# Patient Record
Sex: Female | Born: 1975 | Race: White | Hispanic: No | Marital: Married | Smoking: Never smoker
Health system: Southern US, Community
[De-identification: ages and names within clinical notes are randomized; demographics above are authoritative.]

## PROBLEM LIST (undated history)

## (undated) DIAGNOSIS — A630 Anogenital (venereal) warts: Secondary | ICD-10-CM

## (undated) HISTORY — PX: NO PAST SURGERIES: SHX2092

## (undated) HISTORY — DX: Anogenital (venereal) warts: A63.0

---

## 2013-04-21 DIAGNOSIS — A630 Anogenital (venereal) warts: Secondary | ICD-10-CM

## 2013-04-21 HISTORY — DX: Anogenital (venereal) warts: A63.0

## 2015-03-05 ENCOUNTER — Encounter: Payer: Self-pay | Admitting: Family Medicine

## 2015-03-05 ENCOUNTER — Ambulatory Visit (INDEPENDENT_AMBULATORY_CARE_PROVIDER_SITE_OTHER): Payer: BLUE CROSS/BLUE SHIELD | Admitting: Family Medicine

## 2015-03-05 VITALS — BP 107/56 | HR 91 | Temp 98.2°F | Resp 16 | Ht 68.0 in | Wt 147.5 lb

## 2015-03-05 DIAGNOSIS — R6889 Other general symptoms and signs: Secondary | ICD-10-CM | POA: Diagnosis not present

## 2015-03-05 DIAGNOSIS — R319 Hematuria, unspecified: Secondary | ICD-10-CM

## 2015-03-05 DIAGNOSIS — N390 Urinary tract infection, site not specified: Secondary | ICD-10-CM | POA: Diagnosis not present

## 2015-03-05 LAB — POCT INFLUENZA A/B
INFLUENZA B, POC: NEGATIVE
Influenza A, POC: NEGATIVE

## 2015-03-05 LAB — POCT URINALYSIS DIPSTICK
Glucose, UA: NEGATIVE
KETONES UA: NEGATIVE
PH UA: 5
Protein, UA: NEGATIVE
SPEC GRAV UA: 1.01
Urobilinogen, UA: NEGATIVE

## 2015-03-05 MED ORDER — NITROFURANTOIN MONOHYD MACRO 100 MG PO CAPS: 100.0000 mg | ORAL_CAPSULE | Freq: Two times a day (BID) | ORAL | Status: DC

## 2015-03-05 MED ORDER — CIPROFLOXACIN HCL 500 MG PO TABS: 500.0000 mg | ORAL_TABLET | Freq: Two times a day (BID) | ORAL | Status: AC

## 2015-03-05 NOTE — Progress Notes (Signed)
Subjective:    Patient ID: Lori Moran, female    DOB: 1975-07-26, 39 y.o.   MRN: [id number]  HPI: Lori Moran is a 39 y.o. female presenting on 03/05/2015 for Urinary Tract Infection   HPI  Pt presents as a new patient today with a possible UTI. Symptoms began on Thursday evening with urinary frequency. Low abdominal pain on Sunday. Pt also feeling malaise with no nausea. Pt reporting low back pain. Low grade fever at home. 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 She is also reporting chills and malaise.   Health maintenance:  Last pap- 1 year ago.  Exercises regularly. Tries to eat a healthy diet.   History reviewed. No pertinent past medical history. Social History   Social History  . Marital Status: Single    Spouse Name: N/A  . Number of Children: N/A  . Years of Education: N/A   Occupational History  . Not on file.   Social History Main Topics  . Smoking status: Never Smoker   . Smokeless tobacco: Not on file  . Alcohol Use: No  . Drug Use: No  . Sexual Activity: Yes     Comment: partner had vasectomy   Other Topics Concern  . Not on file   Social History Narrative  . No narrative on file   Family History  Problem Relation Age of Onset  . Healthy Mother   . Heart disease Maternal Grandfather    No current outpatient prescriptions on file prior to visit.   No current facility-administered medications on file prior to visit.    Review of Systems  Constitutional: Positive for fever (low grade at home- 99) and chills.  HENT: Negative for congestion, rhinorrhea and sinus pressure.   Respiratory: Negative for shortness of breath and wheezing.   Gastrointestinal: Positive for abdominal pain.  Genitourinary: Positive for dysuria, urgency and frequency. Negative for vaginal bleeding and vaginal discharge.  Musculoskeletal: Positive for myalgias.  Neurological: Negative for syncope and light-headedness.    Psychiatric/Behavioral: Negative.    Per HPI unless specifically indicated above     Objective:    BP 107/56 mmHg  Pulse 91  Temp(Src) 98.2 F (36.8 C) (Oral)  Resp 16  Ht 5\' 8"  (1.727 m)  Wt 147 lb 8 oz (66.906 kg)  BMI 22.43 kg/m2  LMP  (LMP Unknown)  Wt Readings from Last 3 Encounters:  03/05/15 147 lb 8 oz (66.906 kg)    Physical Exam  Constitutional: She is oriented to person, place, and time. She appears well-developed and well-nourished. No distress.  HENT:  Head: Normocephalic and atraumatic.  Right Ear: Hearing and tympanic membrane normal.  Left Ear: Hearing and tympanic membrane normal.  Nose: No mucosal edema or rhinorrhea. Right sinus exhibits no maxillary sinus tenderness and no frontal sinus tenderness. Left sinus exhibits no maxillary sinus tenderness and no frontal sinus tenderness.  Mouth/Throat: Uvula is midline and mucous membranes are normal. No oropharyngeal exudate or posterior oropharyngeal erythema.  Cardiovascular: Normal rate and regular rhythm.  Exam reveals no gallop and no friction rub.   No murmur heard. Pulmonary/Chest: Effort normal and breath sounds normal. No respiratory distress.  Abdominal: Soft. Normal appearance. There is CVA tenderness.  Neurological: She is alert and oriented to person, place, and time. No cranial nerve deficit. Coordination normal.  Skin: She is not diaphoretic.  Psychiatric: Her behavior is normal.   Results for orders placed or performed in visit on 03/05/15  POCT urinalysis dipstick  Result Value Ref Range   Color, UA yellow    Clarity, UA cloudy    Glucose, UA negative    Bilirubin, UA neative    Ketones, UA negaitve    Spec Grav, UA 1.010    Blood, UA trace    pH, UA 5.0    Protein, UA negative    Urobilinogen, UA negative    Nitrite, UA trace    Leukocytes, UA Trace (A) Negative  POCT Influenza A/B  Result Value Ref Range   Influenza A, POC Negative Negative   Influenza B, POC Negative Negative       Assessment & Plan:   Problem List Items Addressed This Visit    None    Visit Diagnoses    Urinary tract infection with hematuria, site unspecified    -  Primary    Treat with Cipro BID x 5 days. Alarm symptoms reviewed. Return precautions reviewed. Urine culture sent.     Relevant Medications    ciprofloxacin (CIPRO) 500 MG tablet    Other Relevant Orders    POCT urinalysis dipstick (Completed)    CULTURE, URINE COMPREHENSIVE    Flu-like symptoms        Flu swab negative. Likely from UTI. However given pt profession might be virus. Alarm symptoms reviewed.     Relevant Orders    POCT Influenza A/B (Completed)       Meds ordered this encounter  Medications  . DISCONTD: nitrofurantoin, macrocrystal-monohydrate, (MACROBID) 100 MG capsule    Sig: Take 1 capsule (100 mg total) by mouth 2 (two) times daily.    Dispense:  14 capsule    Refill:  0    Order Specific Question:  Supervising Provider    Answer:  Arlis Porta [id number]  . ciprofloxacin (CIPRO) 500 MG tablet    Sig: Take 1 tablet (500 mg total) by mouth 2 (two) times daily.    Dispense:  10 tablet    Refill:  0    Order Specific Question:  Supervising Provider    Answer:  Arlis Porta [id number]      Follow up plan: Return if symptoms worsen or fail to improve.

## 2015-03-05 NOTE — Patient Instructions (Signed)

## 2015-03-07 ENCOUNTER — Telehealth: Payer: Self-pay | Admitting: *Deleted

## 2015-03-07 LAB — CULTURE, URINE COMPREHENSIVE

## 2015-03-07 NOTE — Telephone Encounter (Signed)
Called and spoke with patient. She was advised to go to the ER last night but did not go. She is feeling much better this morning after taking ibuprofen. Reinforced symptoms that need to be address in ER or Urgent care. Her urine culture is not back yet.  Pt instructed to complete antibiotic for UTI.

## 2015-03-07 NOTE — Addendum Note (Signed)
Addended by: Frederich Cha D on: 03/07/2015 11:34 AM   Modules accepted: Miquel Dunn

## 2015-03-07 NOTE — Addendum Note (Signed)
Addended by: Frederich Cha D on: 03/07/2015 09:38 AM   Modules accepted: Miquel Dunn

## 2015-03-07 NOTE — Telephone Encounter (Signed)
Patient was seen on yesterday. She called after hours c/o abd pain with mild back pain. Pain 2/10. She was dx with UTI and pr Cipro 500 bid given.

## 2016-05-29 ENCOUNTER — Encounter: Payer: Self-pay | Admitting: Family Medicine

## 2016-05-30 ENCOUNTER — Ambulatory Visit (INDEPENDENT_AMBULATORY_CARE_PROVIDER_SITE_OTHER): Payer: BLUE CROSS/BLUE SHIELD | Admitting: Physician Assistant

## 2016-05-30 ENCOUNTER — Encounter: Payer: Self-pay | Admitting: Physician Assistant

## 2016-05-30 VITALS — BP 113/66 | HR 61 | Resp 16 | Ht 68.0 in | Wt 143.0 lb

## 2016-05-30 DIAGNOSIS — R350 Frequency of micturition: Secondary | ICD-10-CM

## 2016-05-30 DIAGNOSIS — M545 Low back pain, unspecified: Secondary | ICD-10-CM

## 2016-05-30 LAB — POCT URINALYSIS DIPSTICK
Bilirubin, UA: NEGATIVE
Blood, UA: NEGATIVE
Glucose, UA: NEGATIVE
Ketone: NEGATIVE
Leukocytes, UA: NEGATIVE
Nitrite, UA: NEGATIVE
Protein, UA: NEGATIVE
Spec Grav, UA: <=1.005
Urobilinogen, UA: 0.2
pH, UA: 5

## 2016-05-30 NOTE — Progress Notes (Signed)
   Subjective:    Patient ID: Lori Moran, female    DOB: 1975-05-07, 41 y.o.   MRN: [id number]  Lori Moran is a 41 y.o. female presenting on 05/30/2016 for Urinary Tract Infection   HPI   Patient is a 41 y/o female with no significant PMH presenting today for evaluation of low back pain and urinary frequency. She ran for thirty minutes a couple days ago and experienced some bilateral low back pain for a couple hours. She says the pain was severe. Did not radiate. She also had some lower abdominal pain. No nausea or vomiting. No vaginal discharge of bleeding. LMP was one week ago. No injuries that she knows of. She also thought she might be having some urinary frequency, but denies dysuria. No leg numbness, tingling, weakness.  Social History  Substance Use Topics  . Smoking status: Never Smoker  . Smokeless tobacco: Never Used  . Alcohol use No    Review of Systems Per HPI unless specifically indicated above     Objective:    BP 113/66 (BP Location: Right Arm, Patient Position: Sitting, Cuff Size: Normal)   Pulse 61   Resp 16   Ht 5\' 8"  (1.727 m)   Wt 143 lb (64.9 kg)   BMI 21.74 kg/m   Wt Readings from Last 3 Encounters:  05/30/16 143 lb (64.9 kg)  03/05/15 147 lb 8 oz (66.9 kg)    Physical Exam  Constitutional: She appears well-developed and well-nourished.  Cardiovascular: Normal rate.   Pulmonary/Chest: Effort normal.  Abdominal: Soft. Normal appearance and bowel sounds are normal. She exhibits no distension and no mass. There is no tenderness. There is no rebound, no guarding and no CVA tenderness.  Neurological: She is alert.  Skin: Skin is warm and dry.  Psychiatric: She has a normal mood and affect. Her behavior is normal.   Results for orders placed or performed in visit on 05/30/16  POCT urinalysis dipstick  Result Value Ref Range   Color, UA yellow    Clarity, UA clear    Glucose, UA neg    Bilirubin, UA neg    Ketone neg    Spec Grav, UA <=1.005    Blood,  UA neg    pH, UA 5.0    Protein, UA neg    Urobilinogen, UA 0.2    Nitrite, UA neg    Leukocytes, UA Negative Negative      Assessment & Plan:   1. Urinary frequency  Urinalysis negative in office, patient not symptomatic, will not send for culture.  - POCT urinalysis dipstick  2. Acute bilateral low back pain without sciatica  Seems transient in the context of exercise. Can use pain relief as PRN, heat, ice. Activity as tolerated.  Follow up plan: Return if symptoms worsen or fail to improve.  A total of 15 minutes was spent face-to-face with this patient. Greater than 50% of this time was spent in counseling and coordination of care with the patient.   Carles Collet, PA-C [facility] 05/30/2016, 8:51 AM

## 2016-05-30 NOTE — Patient Instructions (Signed)
Back Pain, Adult Introduction Back pain is very common. The pain often gets better over time. The cause of back pain is usually not dangerous. Most people can learn to manage their back pain on their own. Follow these instructions at home: Watch your back pain for any changes. The following actions may help to lessen any pain you are feeling:  Stay active. Start with short walks on flat ground if you can. Try to walk farther each day.  Exercise regularly as told by your doctor. Exercise helps your back heal faster. It also helps avoid future injury by keeping your muscles strong and flexible.  Do not sit, drive, or stand in one place for more than 30 minutes.  Do not stay in bed. Resting more than 1-2 days can slow down your recovery.  Be careful when you bend or lift an object. Use good form when lifting:  Bend at your knees.  Keep the object close to your body.  Do not twist.  Sleep on a firm mattress. Lie on your side, and bend your knees. If you lie on your back, put a pillow under your knees.  Take medicines only as told by your doctor.  Put ice on the injured area.  Put ice in a plastic bag.  Place a towel between your skin and the bag.  Leave the ice on for 20 minutes, 2-3 times a day for the first 2-3 days. After that, you can switch between ice and heat packs.  Avoid feeling anxious or stressed. Find good ways to deal with stress, such as exercise.  Maintain a healthy weight. Extra weight puts stress on your back. Contact a doctor if:  You have pain that does not go away with rest or medicine.  You have worsening pain that goes down into your legs or buttocks.  You have pain that does not get better in one week.  You have pain at night.  You lose weight.  You have a fever or chills. Get help right away if:  You cannot control when you poop (bowel movement) or pee (urinate).  Your arms or legs feel weak.  Your arms or legs lose feeling  (numbness).  You feel sick to your stomach (nauseous) or throw up (vomit).  You have belly (abdominal) pain.  You feel like you may pass out (faint). This information is not intended to replace advice given to you by your health care provider. Make sure you discuss any questions you have with your health care provider. Document Released: 09/24/2007 Document Revised: 09/13/2015 Document Reviewed: 08/09/2013  2017 Elsevier  

## 2016-06-25 ENCOUNTER — Ambulatory Visit (INDEPENDENT_AMBULATORY_CARE_PROVIDER_SITE_OTHER): Payer: BLUE CROSS/BLUE SHIELD | Admitting: Obstetrics and Gynecology

## 2016-06-25 ENCOUNTER — Encounter: Payer: Self-pay | Admitting: Obstetrics and Gynecology

## 2016-06-25 VITALS — BP 106/65 | HR 60 | Ht 69.0 in | Wt 142.2 lb

## 2016-06-25 DIAGNOSIS — Z124 Encounter for screening for malignant neoplasm of cervix: Secondary | ICD-10-CM

## 2016-06-25 DIAGNOSIS — Z01419 Encounter for gynecological examination (general) (routine) without abnormal findings: Secondary | ICD-10-CM

## 2016-06-25 DIAGNOSIS — Z131 Encounter for screening for diabetes mellitus: Secondary | ICD-10-CM

## 2016-06-25 NOTE — Patient Instructions (Addendum)
Health Maintenance, Female Adopting a healthy lifestyle and getting preventive care can go a long way to promote health and wellness. Talk with your health care provider about what schedule of regular examinations is right for you. This is a good chance for you to check in with your provider about disease prevention and staying healthy. In between checkups, there are plenty of things you can do on your own. Experts have done a lot of research about which lifestyle changes and preventive measures are most likely to keep you healthy. Ask your health care provider for more information. Weight and diet Eat a healthy diet  Be sure to include plenty of vegetables, fruits, low-fat dairy products, and lean protein.  Do not eat a lot of foods high in solid fats, added sugars, or salt.  Get regular exercise. This is one of the most important things you can do for your health.  Most adults should exercise for at least 150 minutes each week. The exercise should increase your heart rate and make you sweat (moderate-intensity exercise).  Most adults should also do strengthening exercises at least twice a week. This is in addition to the moderate-intensity exercise. Maintain a healthy weight  Body mass index (BMI) is a measurement that can be used to identify possible weight problems. It estimates body fat based on height and weight. Your health care provider can help determine your BMI and help you achieve or maintain a healthy weight.  For females 76 years of age and older:  A BMI below 18.5 is considered underweight.  A BMI of 18.5 to 24.9 is normal.  A BMI of 25 to 29.9 is considered overweight.  A BMI of 30 and above is considered obese. Watch levels of cholesterol and blood lipids  You should start having your blood tested for lipids and cholesterol at 39 years of age, then have this test every 5 years.  You may need to have your cholesterol levels checked more often if:  Your lipid or  cholesterol levels are high.  You are older than 39 years of age.  You are at high risk for heart disease. Cancer screening Lung Cancer  Lung cancer screening is recommended for adults 64-42 years old who are at high risk for lung cancer because of a history of smoking.  A yearly low-dose CT scan of the lungs is recommended for people who:  Currently smoke.  Have quit within the past 15 years.  Have at least a 30-pack-year history of smoking. A pack year is smoking an average of one pack of cigarettes a day for 1 year.  Yearly screening should continue until it has been 15 years since you quit.  Yearly screening should stop if you develop a health problem that would prevent you from having lung cancer treatment. Breast Cancer  Practice breast self-awareness. This means understanding how your breasts normally appear and feel.  It also means doing regular breast self-exams. Let your health care provider know about any changes, no matter how small.  If you are in your 20s or 30s, you should have a clinical breast exam (CBE) by a health care provider every 1-3 years as part of a regular health exam.  If you are 34 or older, have a CBE every year. Also consider having a breast X-ray (mammogram) every year.  If you have a family history of breast cancer, talk to your health care provider about genetic screening.  If you are at high risk for breast cancer, talk  to your health care provider about having an MRI and a mammogram every year.  Breast cancer gene (BRCA) assessment is recommended for women who have family members with BRCA-related cancers. BRCA-related cancers include:  Breast.  Ovarian.  Tubal.  Peritoneal cancers.  Results of the assessment will determine the need for genetic counseling and BRCA1 and BRCA2 testing. Cervical Cancer  Your health care provider may recommend that you be screened regularly for cancer of the pelvic organs (ovaries, uterus, and vagina).  This screening involves a pelvic examination, including checking for microscopic changes to the surface of your cervix (Pap test). You may be encouraged to have this screening done every 3 years, beginning at age 24.  For women ages 66-65, health care providers may recommend pelvic exams and Pap testing every 3 years, or they may recommend the Pap and pelvic exam, combined with testing for human papilloma virus (HPV), every 5 years. Some types of HPV increase your risk of cervical cancer. Testing for HPV may also be done on women of any age with unclear Pap test results.  Other health care providers may not recommend any screening for nonpregnant women who are considered low risk for pelvic cancer and who do not have symptoms. Ask your health care provider if a screening pelvic exam is right for you.  If you have had past treatment for cervical cancer or a condition that could lead to cancer, you need Pap tests and screening for cancer for at least 20 years after your treatment. If Pap tests have been discontinued, your risk factors (such as having a new sexual partner) need to be reassessed to determine if screening should resume. Some women have medical problems that increase the chance of getting cervical cancer. In these cases, your health care provider may recommend more frequent screening and Pap tests. Colorectal Cancer  This type of cancer can be detected and often prevented.  Routine colorectal cancer screening usually begins at 39 years of age and continues through 39 years of age.  Your health care provider may recommend screening at an earlier age if you have risk factors for colon cancer.  Your health care provider may also recommend using home test kits to check for hidden blood in the stool.  A small camera at the end of a tube can be used to examine your colon directly (sigmoidoscopy or colonoscopy). This is done to check for the earliest forms of colorectal cancer.  Routine  screening usually begins at age 41.  Direct examination of the colon should be repeated every 5-10 years through 39 years of age. However, you may need to be screened more often if early forms of precancerous polyps or small growths are found. Skin Cancer  Check your skin from head to toe regularly.  Tell your health care provider about any new moles or changes in moles, especially if there is a change in a mole's shape or color.  Also tell your health care provider if you have a mole that is larger than the size of a pencil eraser.  Always use sunscreen. Apply sunscreen liberally and repeatedly throughout the day.  Protect yourself by wearing long sleeves, pants, a wide-brimmed hat, and sunglasses whenever you are outside. Heart disease, diabetes, and high blood pressure  High blood pressure causes heart disease and increases the risk of stroke. High blood pressure is more likely to develop in:  People who have blood pressure in the high end of the normal range (130-139/85-89 mm Hg).  People who are overweight or obese.  People who are African American.  If you are 59-24 years of age, have your blood pressure checked every 3-5 years. If you are 34 years of age or older, have your blood pressure checked every year. You should have your blood pressure measured twice-once when you are at a hospital or clinic, and once when you are not at a hospital or clinic. Record the average of the two measurements. To check your blood pressure when you are not at a hospital or clinic, you can use:  An automated blood pressure machine at a pharmacy.  A home blood pressure monitor.  If you are between 29 years and 60 years old, ask your health care provider if you should take aspirin to prevent strokes.  Have regular diabetes screenings. This involves taking a blood sample to check your fasting blood sugar level.  If you are at a normal weight and have a low risk for diabetes, have this test once  every three years after 39 years of age.  If you are overweight and have a high risk for diabetes, consider being tested at a younger age or more often. Preventing infection Hepatitis B  If you have a higher risk for hepatitis B, you should be screened for this virus. You are considered at high risk for hepatitis B if:  You were born in a country where hepatitis B is common. Ask your health care provider which countries are considered high risk.  Your parents were born in a high-risk country, and you have not been immunized against hepatitis B (hepatitis B vaccine).  You have HIV or AIDS.  You use needles to inject street drugs.  You live with someone who has hepatitis B.  You have had sex with someone who has hepatitis B.  You get hemodialysis treatment.  You take certain medicines for conditions, including cancer, organ transplantation, and autoimmune conditions. Hepatitis C  Blood testing is recommended for:  Everyone born from [birth year] through 1963.  Anyone with known risk factors for hepatitis C. Sexually transmitted infections (STIs)  You should be screened for sexually transmitted infections (STIs) including gonorrhea and chlamydia if:  You are sexually active and are younger than 39 years of age.  You are older than 39 years of age and your health care provider tells you that you are at risk for this type of infection.  Your sexual activity has changed since you were last screened and you are at an increased risk for chlamydia or gonorrhea. Ask your health care provider if you are at risk.  If you do not have HIV, but are at risk, it may be recommended that you take a prescription medicine daily to prevent HIV infection. This is called pre-exposure prophylaxis (PrEP). You are considered at risk if:  You are sexually active and do not regularly use condoms or know the HIV status of your partner(s).  You take drugs by injection.  You are sexually active with a partner  who has HIV. Talk with your health care provider about whether you are at high risk of being infected with HIV. If you choose to begin PrEP, you should first be tested for HIV. You should then be tested every 3 months for as long as you are taking PrEP. Pregnancy  If you are premenopausal and you may become pregnant, ask your health care provider about preconception counseling.  If you may become pregnant, take 400 to 800 micrograms (mcg) of folic acid  every day.  If you want to prevent pregnancy, talk to your health care provider about birth control (contraception). Osteoporosis and menopause  Osteoporosis is a disease in which the bones lose minerals and strength with aging. This can result in serious bone fractures. Your risk for osteoporosis can be identified using a bone density scan.  If you are 4 years of age or older, or if you are at risk for osteoporosis and fractures, ask your health care provider if you should be screened.  Ask your health care provider whether you should take a calcium or vitamin D supplement to lower your risk for osteoporosis.  Menopause may have certain physical symptoms and risks.  Hormone replacement therapy may reduce some of these symptoms and risks. Talk to your health care provider about whether hormone replacement therapy is right for you. Follow these instructions at home:  Schedule regular health, dental, and eye exams.  Stay current with your immunizations.  Do not use any tobacco products including cigarettes, chewing tobacco, or electronic cigarettes.  If you are pregnant, do not drink alcohol.  If you are breastfeeding, limit how much and how often you drink alcohol.  Limit alcohol intake to no more than 1 drink per day for nonpregnant women. One drink equals 12 ounces of beer, 5 ounces of wine, or 1 ounces of hard liquor.  Do not use street drugs.  Do not share needles.  Ask your health care provider for help if you need support  or information about quitting drugs.  Tell your health care provider if you often feel depressed.  Tell your health care provider if you have ever been abused or do not feel safe at home. This information is not intended to replace advice given to you by your health care provider. Make sure you discuss any questions you have with your health care provider. Document Released: 10/21/2010 Document Revised: 09/13/2015 Document Reviewed: 01/09/2015 Elsevier Interactive Patient Education  2017 [employer].

## 2016-06-25 NOTE — Progress Notes (Signed)
Subjective:     Lori Moran is a 41 y.o. G41P0010 female here to establish care and for a routine exam.  Has relocated from [place].  Current complaints: None.  Personal health questionnaire reviewed: yes.     Gynecologic History Patient's last menstrual period was 06/16/2016. Contraception: vasectomy Last Pap: 2015. Results were: normal.  No prior h/o abnormal pap smears.  Denies h/o STIs.  Last mammogram: never had one.   Obstetric History OB History  Gravida Para Term Preterm AB Living  1       1    SAB TAB Ectopic Multiple Live Births               # Outcome Date GA Lbr Len/2nd Weight Sex Delivery Anes PTL Lv  1 AB 2011               Past Medical History:  Diagnosis Date  . 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 NO PAST SURGERIES      Family History  Problem Relation Age of Onset  . Healthy Mother   . Heart disease Paternal Grandmother   . Diabetes Paternal Grandmother     Social History   Social History  . Marital status: Married    Spouse name: N/A  . Number of children: N/A  . Years of education: N/A   Occupational History  . Not on file.   Social History Main Topics  . Smoking status: Never Smoker  . Smokeless tobacco: Never Used  . Alcohol use No  . Drug use: No  . Sexual activity: Yes    Birth control/ protection: None     Comment: partner had vasectomy   Other Topics Concern  . Not on file   Social History Narrative  . No narrative on file    No current outpatient prescriptions on file prior to visit.   No current facility-administered medications on file prior to visit.     No Known Allergies    Review of Systems Constitutional: negative for chills, fatigue, fevers and sweats Eyes: negative for irritation, redness and visual disturbance Ears, nose, mouth, throat, and face: negative for hearing loss, nasal congestion, snoring and  tinnitus Respiratory: negative for asthma, cough, sputum Cardiovascular: negative for chest pain, dyspnea, exertional chest pressure/discomfort, irregular heart beat, palpitations and syncope Gastrointestinal: negative for abdominal pain, change in bowel habits, nausea and vomiting Genitourinary: negative for abnormal menstrual periods, genital lesions, sexual problems and vaginal discharge, dysuria and urinary incontinence Integument/breast: negative for breast lump, breast tenderness and nipple discharge Hematologic/lymphatic: negative for bleeding and easy bruising Musculoskeletal:negative for back pain and muscle weakness Neurological: negative for dizziness, headaches, vertigo and weakness Endocrine: negative for diabetic symptoms including polydipsia, polyuria and skin dryness Allergic/Immunologic: negative for hay fever and urticaria      Objective:    BP 106/65 (BP Location: Left Arm, Patient Position: Sitting, Cuff Size: Normal)   Pulse 60   Ht 5\' 9"  (1.753 m)   Wt 142 lb 3.2 oz (64.5 kg)   LMP 06/16/2016   BMI 21.00 kg/m   General Appearance:    Alert, cooperative, no distress, appears stated age  Head:    Normocephalic, without obvious abnormality, atraumatic  Eyes:    PERRL, conjunctiva/corneas clear, EOM's intact, both eyes  Ears:    Normal TM's and external ear canals, both ears  Nose:   Nares normal,  septum midline, mucosa normal, no drainage or sinus tenderness  Throat:   Lips, mucosa, and tongue normal; teeth and gums normal  Neck:   Supple, symmetrical, trachea midline, no adenopathy;    thyroid:  no enlargement/tenderness/nodules; no carotid   bruit or JVD  Back:     Symmetric, no curvature, ROM normal, no CVA tenderness  Lungs:     Clear to auscultation bilaterally, respirations unlabored  Chest Wall:    No tenderness or deformity   Heart:    Regular rate and rhythm, S1 and S2 normal, no murmur, rub or gallop  Breast Exam:    No tenderness, masses, or nipple  abnormality  Abdomen:     Soft, non-tender, bowel sounds active all four quadrants,    no masses, no organomegaly  Genitalia:    Normal female without lesion, discharge or tenderness  Rectal:    Normal tone,  no masses or tenderness  Extremities:   Extremities normal, atraumatic, no cyanosis or edema  Pulses:   2+ and symmetric all extremities  Skin:   Skin color, texture, turgor normal, no rashes or lesions  Lymph nodes:   Cervical, supraclavicular, and axillary nodes normal  Neurologic:   CNII-XII intact, normal strength, sensation and reflexes    throughout      Assessment:    Healthy female exam.    Plan:   Labs ordered: CBC, CMP, and Vitamin D.   Education reviewed: self breast exams, skin cancer screening and weight bearing exercise. Contraception: vasectomy. Pap smear performed today.  Follow up in: 1 year for annual exam.

## 2016-06-27 LAB — PAP IG AND HPV HIGH-RISK
HPV, high-risk: POSITIVE — AB
PAP Smear Comment: 0

## 2016-11-17 ENCOUNTER — Ambulatory Visit: Payer: BLUE CROSS/BLUE SHIELD | Admitting: Nurse Practitioner

## 2016-11-17 ENCOUNTER — Ambulatory Visit (INDEPENDENT_AMBULATORY_CARE_PROVIDER_SITE_OTHER): Payer: BLUE CROSS/BLUE SHIELD | Admitting: Nurse Practitioner

## 2016-11-17 ENCOUNTER — Encounter: Payer: Self-pay | Admitting: Nurse Practitioner

## 2016-11-17 VITALS — BP 121/61 | HR 76 | Temp 98.2°F | Ht 69.0 in | Wt 143.6 lb

## 2016-11-17 DIAGNOSIS — R799 Abnormal finding of blood chemistry, unspecified: Secondary | ICD-10-CM | POA: Diagnosis not present

## 2016-11-17 DIAGNOSIS — F419 Anxiety disorder, unspecified: Secondary | ICD-10-CM

## 2016-11-17 LAB — TSH: TSH: 0.83 mIU/L

## 2016-11-17 NOTE — Patient Instructions (Addendum)
Lori Moran, Thank you for coming in to clinic today.  1. For your anxiety: - I recommend buspirone 5 mg twice daily. - I also recommend counseling.  Self Referral: 1. [facility]   Address: [address], [place], [place] [address] Hours: Open today  9AM-7PM Phone: [phone]  2. [facility] [place] Address: [address] [place], [place], [place] [address] Phone: [phone]    Please schedule a follow-up appointment with Cassell Smiles, AGNP. Return in about 4 weeks (around 12/15/2016) for annual physical.  If you have any other questions or concerns, please feel free to call the clinic or send a message through [facility]. You may also schedule an earlier appointment if necessary.  You will receive a survey after today's visit either digitally by e-mail or paper by [employer]. Your experiences and feedback matter to [place].  Please respond so we know how we are doing as we provide care for you.   Cassell Smiles, DNP, AGNP-BC Adult Gerontology Nurse Practitioner [facility]

## 2016-11-17 NOTE — Progress Notes (Addendum)
Subjective:    Patient ID: Lori Moran, female    DOB: July 09, 1975, 41 y.o.   MRN: [id number]  Lori Moran is a 41 y.o. female presenting on 11/17/2016 for Annual Exam (fatigue. Abnormal TSH results x 1.44yrs ago. Pt didn't f/u on the labs. 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  Requested by her supervisor to have evaluation and may need to have [facility] observation. - Pt notes symptoms of needing to be in control or is more likely to have panic attack. - Attacks occur in a car, scuba diving, flying in airplane when not in flight attendant duties.  Symptoms include "being scared", increased respiratory rate, feels warm, fearful, heart racing.   - Pt denies fever, chills, sweats, nausea, vomiting, diarrhea and constipation. - Finds exercise helps reduce some of her nervousness. - Works at [employer] as full time job, parent, grieving loss of Psychiatrist, part time job as Catering manager  Married in Dec. 2016 Panic attacks started after marriage to new husband w/ 2 children - 104 year old son - 56 year old passed away 16-Sep-2016  Grieving this loss and presumed overdose, so feels she is carrying responsibility and burden for parenting her 45 year old stepson.  She is undergoing counseling w/ couples grief counselor. Lack of control w/ children (she and her husband have custody, but don't feel support for discipline from biological mother).  Depression screen [facility] 2/9 11/17/2016 03/05/2015  Decreased Interest 0 0  Down, Depressed, Hopeless 0 0  PHQ - 2 Score 0 0  Altered sleeping 0 -  Tired, decreased energy 2 -  Change in appetite 0 -  Feeling bad or failure about yourself  1 -  Trouble concentrating 0 -  Moving slowly or fidgety/restless 0 -  Suicidal thoughts 0 -  PHQ-9 Score 3 -   GAD 7 : Generalized Anxiety Score 11/17/2016  Nervous,  Anxious, on Edge 3  Control/stop worrying 2  Worry too much - different things 2  Trouble relaxing 1  Restless 0  Easily annoyed or irritable 1  Afraid - awful might happen 3  Total GAD 7 Score 12  Anxiety Difficulty Somewhat difficult    Social History  Substance Use Topics  . Smoking status: Never Smoker  . Smokeless tobacco: Never Used  . Alcohol use No    Review of Systems Per HPI unless specifically indicated above     Objective:    BP 121/61 (BP Location: Right Arm, Patient Position: Sitting)   Pulse 76   Temp 98.2 F (36.8 C) (Oral)   Ht 5\' 9"  (1.753 m)   Wt 143 lb 9.6 oz (65.1 kg)   BMI 21.21 kg/m   Wt Readings from Last 3 Encounters:  11/17/16 143 lb 9.6 oz (65.1 kg)  06/25/16 142 lb 3.2 oz (64.5 kg)  05/30/16 143 lb (64.9 kg)    Physical Exam General - healthy, well-appearing, NAD HEENT - Normocephalic, atraumatic, PERRL, EOMI, patent nares w/o congestion, oropharynx clear, MMM Neck - supple, non-tender, no LAD, no thyromegaly Heart - RRR, no murmurs heard Lungs - Clear throughout all lobes, no wheezing, crackles, or rhonchi. Normal work of breathing. Abdomen - soft, NTND, no masses, no hepatosplenomegaly, active bowel sounds Extremeties - non-tender, no edema, cap refill < 2 seconds, peripheral pulses intact +2 bilaterally Skin -  warm, dry, no rashes Neuro - awake, alert, oriented x3, intact muscle strength 5/5 bilaterally, intact distal sensation to light touch, normal coordination, normal gait Psych - Anxious mood and affect.  Pt unable to maintain eye contact during conversation.  Tearful at times.   Results for orders placed or performed in visit on 11/17/16  TSH  Result Value Ref Range   TSH 0.83 mIU/L      Assessment & Plan:   Problem List Items Addressed This Visit      Other   Anxiety - Primary    Uncontrolled.  Pt w/ new panic attacks.  Pt well equipped w/ techniques and has not had paralysis associated w/ panic attack.  Plan: 1.  Discussed preference to start buspirone or SSRI today.  Pt refuses. 2. Encouraged counseling for self separate from grief counseling.  References provided. 3. Follow up 4 weeks.      Relevant Orders   TSH (Completed)    Other Visit Diagnoses    Abnormal blood chemistry       Pt w/ previously low TSH per pt.   Plan: 1. Recheck TSH today as contributor to anxiety.   Relevant Orders   TSH (Completed)      No orders of the defined types were placed in this encounter.     Follow up plan: Return in about 4 weeks (around 12/15/2016) for annual physical.  A total of 40 minutes was spent face-to-face with this patient. Greater than 50% of this time was spent in counseling and coordination of care with the patient.  Discussing life stresses, symptoms, treatment plan.  Cassell Smiles, DNP, AGPCNP-BC Adult Gerontology Primary Care Nurse Practitioner [facility] 11/18/2016, 10:57 PM

## 2016-11-18 DIAGNOSIS — F419 Anxiety disorder, unspecified: Secondary | ICD-10-CM | POA: Insufficient documentation

## 2016-11-18 NOTE — Progress Notes (Signed)
I have reviewed this encounter including the documentation in this note and/or discussed this patient with the provider, Cassell Smiles, AGPCNP-BC. I am certifying that I agree with the content of this note as supervising physician.  Nobie Putnam, [facility] 11/18/2016, 10:59 PM

## 2016-11-18 NOTE — Assessment & Plan Note (Signed)
Uncontrolled.  Pt w/ new panic attacks.  Pt well equipped w/ techniques and has not had paralysis associated w/ panic attack.  Plan: 1. Discussed preference to start buspirone or SSRI today.  Pt refuses. 2. Encouraged counseling for self separate from grief counseling.  References provided. 3. Follow up 4 weeks.

## 2017-03-18 ENCOUNTER — Ambulatory Visit (INDEPENDENT_AMBULATORY_CARE_PROVIDER_SITE_OTHER): Payer: BLUE CROSS/BLUE SHIELD | Admitting: Obstetrics and Gynecology

## 2017-03-18 ENCOUNTER — Encounter: Payer: Self-pay | Admitting: Obstetrics and Gynecology

## 2017-03-18 VITALS — BP 112/67 | HR 68 | Ht 69.0 in | Wt 138.3 lb

## 2017-03-18 DIAGNOSIS — Z3169 Encounter for other general counseling and advice on procreation: Secondary | ICD-10-CM

## 2017-03-18 NOTE — Patient Instructions (Signed)
Preparing for Pregnancy If you are considering becoming pregnant, make an appointment to see your regular health care provider to learn how to prepare for a safe and healthy pregnancy (preconception care). During a preconception care visit, your health care provider will:  Do a complete physical exam, including a Pap test.  Take a complete medical history.  Give you information, answer your questions, and help you resolve problems.  Preconception checklist Medical history  Tell your health care provider about any current or past medical conditions. Your pregnancy or your ability to become pregnant may be affected by chronic conditions, such as diabetes, chronic hypertension, and thyroid problems.  Include your family's medical history as well as your partner's medical history.  Tell your health care provider about any history of STIs (sexually transmitted infections).These can affect your pregnancy. In some cases, they can be passed to your baby. Discuss any concerns that you have about STIs.  If indicated, discuss the benefits of genetic testing. This testing will show whether there are any genetic conditions that may be passed from you or your partner to your baby.  Tell your health care provider about: ? Any problems you have had with conception or pregnancy. ? Any medicines you take. These include vitamins, herbal supplements, and over-the-counter medicines. ? Your history of immunizations. Discuss any vaccinations that you may need.  Diet  Ask your health care provider what to include in a healthy diet that has a balance of nutrients. This is especially important when you are pregnant or preparing to become pregnant.  Ask your health care provider to help you reach a healthy weight before pregnancy. ? If you are overweight, you may be at higher risk for certain complications, such as high blood pressure, diabetes, and preterm birth. ? If you are underweight, you are more likely  to have a baby who has a low birth weight.  Lifestyle, work, and home  Let your health care provider know: ? About any lifestyle habits that you have, such as alcohol use, drug use, or smoking. ? About recreational activities that may put you at risk during pregnancy, such as downhill skiing and certain exercise programs. ? Tell your health care provider about any international travel, especially any travel to places with an active Zika virus outbreak. ? About harmful substances that you may be exposed to at work or at home. These include chemicals, pesticides, radiation, or even litter boxes. ? If you do not feel safe at home.  Mental health  Tell your health care provider about: ? Any history of mental health conditions, including feelings of depression, sadness, or anxiety. ? Any medicines that you take for a mental health condition. These include herbs and supplements.  Home instructions to prepare for pregnancy Lifestyle  Eat a balanced diet. This includes fresh fruits and vegetables, whole grains, lean meats, low-fat dairy products, healthy fats, and foods that are high in fiber. Ask to meet with a nutritionist or registered dietitian for assistance with meal planning and goals.  Get regular exercise. Try to be active for at least 30 minutes a day on most days of the week. Ask your health care provider which activities are safe during pregnancy.  Do not use any products that contain nicotine or tobacco, such as cigarettes and e-cigarettes. If you need help quitting, ask your health care provider.  Do not drink alcohol.  Do not take illegal drugs.  Maintain a healthy weight. Ask your health care provider what weight range is   right for you.  General instructions  Keep an accurate record of your menstrual periods. This makes it easier for your health care provider to determine your baby's due date.  Begin taking prenatal vitamins and folic acid supplements daily as directed by  your health care provider.  Manage any chronic conditions, such as high blood pressure and diabetes, as told by your health care provider. This is important.  How do I know that I am pregnant? You may be pregnant if you have been sexually active and you miss your period. Symptoms of early pregnancy include:  Mild cramping.  Very light vaginal bleeding (spotting).  Feeling unusually tired.  Nausea and vomiting (morning sickness).  If you have any of these symptoms and you suspect that you might be pregnant, you can take a home pregnancy test. These tests check for a hormone in your urine (human chorionic gonadotropin, or hCG). A woman's body begins to make this hormone during early pregnancy. These tests are very accurate. Wait until at least the first day after you miss your period to take one. If the test shows that you are pregnant (you get a positive result), call your health care provider to make an appointment for prenatal care. What should I do if I become pregnant?  Make an appointment with your health care provider as soon as you suspect you are pregnant.  Do not use any products that contain nicotine, such as cigarettes, chewing tobacco, and e-cigarettes. If you need help quitting, ask your health care provider.  Do not drink alcoholic beverages. Alcohol is related to a number of birth defects.  Avoid toxic odors and chemicals.  You may continue to have sexual intercourse if it does not cause pain or other problems, such as vaginal bleeding. This information is not intended to replace advice given to you by your health care provider. Make sure you discuss any questions you have with your health care provider. Document Released: 03/20/2008 Document Revised: 12/04/2015 Document Reviewed: 10/28/2015 Elsevier Interactive Patient Education  2017 [employer].

## 2017-03-18 NOTE — Progress Notes (Signed)
    GYNECOLOGY PROGRESS NOTE  Subjective:    Patient ID: Lori Moran, female    DOB: 01/23/1976, 41 y.o.   MRN: [id number]  HPI  Patient is a 41 y.o. G31P0010 female who presents for discussion of fertility.  Patient notes that she and her husband are considering getting his vasectomy reversed, and desires to know their chances of conceiving before undergoing the procedure.  Husband had vasectomy performed ~ 10 years ago.     Patient reports that her periods are regularly occurring, usually lasting 3-4 days.  Denies dysmenorrhea or pelvic pain.  She has been pregnant once approximately 7 years ago, however had a miscarriage in 1st trimester (was a different partner).    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 She  has a past surgical history that includes No past surgeries. Her family history includes Diabetes in her paternal grandmother; Healthy in her mother; Heart disease in her paternal grandmother. She  reports that  has never smoked. she has never used smokeless tobacco. She reports that she does not drink alcohol or use drugs. She currently has no medications in their medication list. No current outpatient medications on file prior to visit.   No current facility-administered medications on file prior to visit.    She has No Known Allergies..  Review of Systems A comprehensive review of systems was negative.   Objective:   Blood pressure 112/67, pulse 68, height 5' 9" (1.753 m), weight 138 lb 4.8 oz (62.7 kg), last menstrual period 03/06/2017. General appearance: alert and no distress Remainder of exam deferred   Assessment:   Pre-conception counseling  Plan:   - Advised patient that based on current age, she is still capable of becoming pregnant spontaneously.  Did advise that after the age of 56, she is at a slight increased risk  for the occurrence of genetic disorders such as Down's syndrome, however genetic testing can be offered early in the pregnancy.  Patient and husband currently deny any known family history of genetic disorders.  - Encouraged patient to use a fertility kit for 1 month to assess if ovulation is occurring.  Advised that as long as she is ovulating, she can become pregnant. Also discussed that after vasectomy is reversed, she should begin with timed/purposeful coitus around time of ovulation.  - Discussed possibility that if she and husband do not spontaneously conceive, that she may require further reproductive assistance.  Patient notes that if it requires a specialist to conceive, that they will quit attempts at conception.  - Advised patient to begin taking a PNV and folic acid supplementation.   - To follow up as needed. If patient does not note ovulation, she should return sooner for further evaluation and management.    A total of 15 minutes were spent face-to-face with the patient during this encounter and over half of that time dealt with counseling and coordination of care.   Rubie Maid, MD [facility]

## 2017-05-25 DIAGNOSIS — N978 Female infertility of other origin: Secondary | ICD-10-CM | POA: Diagnosis not present

## 2017-10-29 ENCOUNTER — Encounter: Payer: BLUE CROSS/BLUE SHIELD | Admitting: Nurse Practitioner

## 2017-11-27 ENCOUNTER — Other Ambulatory Visit: Payer: Self-pay

## 2017-11-27 ENCOUNTER — Encounter: Payer: Self-pay | Admitting: Nurse Practitioner

## 2017-11-27 ENCOUNTER — Ambulatory Visit (INDEPENDENT_AMBULATORY_CARE_PROVIDER_SITE_OTHER): Payer: BLUE CROSS/BLUE SHIELD | Admitting: Nurse Practitioner

## 2017-11-27 VITALS — BP 105/60 | HR 56 | Temp 97.9°F | Ht 69.0 in | Wt 139.0 lb

## 2017-11-27 DIAGNOSIS — B977 Papillomavirus as the cause of diseases classified elsewhere: Secondary | ICD-10-CM | POA: Diagnosis not present

## 2017-11-27 DIAGNOSIS — Z0000 Encounter for general adult medical examination without abnormal findings: Secondary | ICD-10-CM

## 2017-11-27 DIAGNOSIS — D225 Melanocytic nevi of trunk: Secondary | ICD-10-CM

## 2017-11-27 NOTE — Progress Notes (Signed)
Subjective:    Patient ID: Lori Moran, female    DOB: March 29, 1976, 42 y.o.   MRN: [id number]  Lori Moran is a 42 y.o. female presenting on 11/27/2017 for Annual Exam   HPI Annual Physical Exam Patient has been feeling well.  They have no acute concerns today.   HEALTH MAINTENANCE: Weight/BMI: healthy Physical activity: regular- 3-4 times per week Diet: healthy Seatbelt: always Sunscreen: regular PAP: 06/2016 - due again as patient is pos HPV Mammogram: discussed HIV/HEP C: declined.  Previously negative. Optometry: every 1-2 years Dentistry: regular  VACCINES: Tetanus: unknown - discussed.  Needs soon.   Past Medical History:  Diagnosis Date  . 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 NO PAST SURGERIES     Social History   Socioeconomic History  . Marital status: Married    Spouse name: Not on file  . Number of children: Not on file  . Years of education: Not on file  . Highest education level: Not on file  Occupational History  . Not on file  Social Needs  . Financial resource strain: Not on file  . Food insecurity:    Worry: Not on file    Inability: Not on file  . Transportation needs:    Medical: Not on file    Non-medical: Not on file  Tobacco Use  . Smoking status: Never Smoker  . Smokeless tobacco: Never Used  Substance and Sexual Activity  . Alcohol use: No    Alcohol/week: 0.0 standard drinks  . Drug use: No  . Sexual activity: Yes    Birth control/protection: None    Comment: partner had vasectomy  Lifestyle  . Physical activity:    Days per week: Not on file    Minutes per session: Not on file  . Stress: Not on file  Relationships  . Social connections:    Talks on phone: Not on file    Gets together: Not on file    Attends religious service: Not on file    Active member of club or organization: Not on file    Attends meetings of clubs or organizations:  Not on file    Relationship status: Not on file  . Intimate partner violence:    Fear of current or ex partner: Not on file    Emotionally abused: Not on file    Physically abused: Not on file    Forced sexual activity: Not on file  Other Topics Concern  . Not on file  Social History Narrative  . Not on file   Family History  Problem Relation Age of Onset  . Healthy Mother   . Heart disease Paternal Grandmother   . Diabetes Paternal Grandmother    No current outpatient medications on file prior to visit.   No current facility-administered medications on file prior to visit.     Review of Systems  Constitutional: Negative for chills and fever.  HENT: Negative for congestion and sore throat.   Eyes: Negative for pain.  Respiratory: Negative for cough, shortness of breath and wheezing.   Cardiovascular: Negative for chest pain, palpitations and leg swelling.  Gastrointestinal: Negative for abdominal pain, blood in stool, constipation, diarrhea, nausea and vomiting.  Endocrine: Negative for polydipsia.  Genitourinary: Negative for dysuria, frequency, hematuria and urgency.  Musculoskeletal: Negative for back pain, myalgias and neck pain.  Skin: Negative for rash.  Nevus concern  Allergic/Immunologic: Negative for environmental allergies.  Neurological: Negative for dizziness, weakness and headaches.  Hematological: Does not bruise/bleed easily.  Psychiatric/Behavioral: Negative for dysphoric mood and suicidal ideas. The patient is not nervous/anxious.    Per HPI unless specifically indicated above     Objective:    BP 105/60 (BP Location: Right Arm, Patient Position: Sitting, Cuff Size: Normal)   Pulse (!) 56   Temp 97.9 F (36.6 C) (Oral)   Ht 5\' 9"  (1.753 m)   Wt 139 lb (63 kg)   LMP 11/20/2017 (Within Days)   BMI 20.53 kg/m   Wt Readings from Last 3 Encounters:  11/27/17 139 lb (63 kg)  03/18/17 138 lb 4.8 oz (62.7 kg)  11/17/16 143 lb 9.6 oz (65.1 kg)      Physical Exam  Constitutional: She is oriented to person, place, and time. She appears well-developed and well-nourished. No distress.  HENT:  Head: Normocephalic and atraumatic.  Right Ear: External ear normal.  Left Ear: External ear normal.  Nose: Nose normal.  Mouth/Throat: Oropharynx is clear and moist.  Eyes: Pupils are equal, round, and reactive to light. Conjunctivae are normal.  Neck: Normal range of motion. Neck supple. No JVD present. No tracheal deviation present. No thyromegaly present.  Cardiovascular: Normal rate, regular rhythm, normal heart sounds and intact distal pulses. Exam reveals no gallop and no friction rub.  No murmur heard. Pulmonary/Chest: Effort normal and breath sounds normal. No respiratory distress.  Breast - Normal exam w/ symmetric breasts, no mass, no nipple discharge, no skin changes or tenderness.  Abdominal: Soft. Bowel sounds are normal. She exhibits no distension. There is no hepatosplenomegaly. There is no tenderness.  Musculoskeletal: Normal range of motion.  Lymphadenopathy:    She has no cervical adenopathy.  Neurological: She is alert and oriented to person, place, and time. No cranial nerve deficit.  Skin: Skin is warm and dry. Capillary refill takes less than 2 seconds.     Psychiatric: She has a normal mood and affect. Her behavior is normal. Judgment and thought content normal.  Nursing note and vitals reviewed.   Results for orders placed or performed in visit on 11/17/16  TSH  Result Value Ref Range   TSH 0.83 mIU/L      Assessment & Plan:   Problem List Items Addressed This Visit    None    Visit Diagnoses    Encounter for annual physical exam    -  Primary   Relevant Orders   Hemoglobin A1c   COMPLETE METABOLIC PANEL WITH GFR   CBC with Differential/Platelet   Lipid panel   TSH   Nevus of back       Relevant Orders   Ambulatory referral to Dermatology   HPV in female          Physical exam with no new findings.   Well adult with no acute concerns.  Desires information about HPV and cervical cancer.  Plan: 1. Obtain health maintenance screenings as above according to age. - Increase physical activity to 30 minutes most days of the week.  - Eat healthy diet high in vegetables and fruits; low in refined carbohydrates. - Discussed HPV and and increased cervical cancer risk.  Recommend annual pap smears for surveillance.  Patient has had all questions answered.  Wants to consider HPV typing for high risk cervical cancer strains with next PAP.  Patient desires to defer PAP to Dr. Marcelline Mates. - Encouraged Tdap.  Patient declines today. -  Irregular nevus of back near lower left scapula.  Referral to dermatology. - Mammogram discussed.  Patient desires to defer today.  Reviewed multiple guidelines of annual screening at age 38 vs every 2 year screening at age 51.  Patient undecided.  May request order if she decides to proceed.  No increased risk of breast cancer from family history.  Clinical breast exam normal today. 2. Return 1 year for annual physical.   Follow up plan: Return in about 1 year (around 11/28/2018) for annual physical.  Cassell Smiles, DNP, AGPCNP-BC Adult Gerontology Primary Care Nurse Practitioner [facility] 11/27/2017, 10:46 AM

## 2017-11-27 NOTE — Addendum Note (Signed)
Addended by: Wilson Singer on: 11/27/2017 10:55 AM   Modules accepted: Miquel Dunn

## 2017-11-27 NOTE — Patient Instructions (Addendum)
Lori Moran,   Thank you for coming in to clinic today.  1. May look up more information about tetanus vaccine online at [email or web address]  2. Recommend repeat PAP smear as soon as you are able to schedule with Dr. Marcelline Mates.  3. Continue regular physical activity with increasing your heart rate for 30 minutes on most days of the week.  4. Let me know if you would like to start mammograms.  I can place the order and you will call to schedule.  Please schedule a follow-up appointment with Cassell Smiles, AGNP. Return in about 1 year (around 11/28/2018) for annual physical.  If you have any other questions or concerns, please feel free to call the clinic or send a message through [place]. You may also schedule an earlier appointment if necessary.  You will receive a survey after today's visit either digitally by e-mail or paper by [employer]. Your experiences and feedback matter to [place].  Please respond so we know how we are doing as we provide care for you.   Cassell Smiles, DNP, AGNP-BC Adult Gerontology Nurse Practitioner [facility], [facility]   Human Papillomavirus Human papillomavirus (HPV) is the most common sexually transmitted infection (STI). It easily spreads from person to person (is highly contagious). HPV infections cause genital warts. Certain types of HPV may cause cancers, including cancer of the lower part of the uterus (cervix), vagina, outer female genital area (vulva), penis, anus, and rectum. HPV may also cause cancers of the oral cavity, such as the throat, tongue, and tonsils. There are many types of HPV. It usually does not cause symptoms. However, sometimes there are wart-like lesions in the throat or warts in the genital area that you can see or feel. It is possible to be infected for long periods and pass HPV to others without knowing it. What are the causes? HPV is caused by a virus that spreads from person to person through sexual contact. This includes  oral, vaginal, or anal sex. What increases the risk? The following factors may make you more likely to develop this condition:  Having unprotected oral, vaginal, or anal sex.  Having several sex partners.  Having a sex partner who has other sex partners.  Having or having had another STI.  Having a weak disease-fighting (immune) system.  Having damaged skin in the genital area.  What are the signs or symptoms? Most people who have HPV do not have any symptoms. If symptoms are present, they may include:  Wartlike lesions in the throat (from having oral sex).  Warts on the infected skin or mucous membranes.  Genital warts that may itch, burn, bleed, or be painful during sexual intercourse.  How is this diagnosed? If wartlike lesions are present in the throat or if genital warts are present, your health care provider can usually diagnose HPV with a physical exam. Genital warts are easily seen. In females, tests may be used to diagnose HPV, including:  A Pap test. A Pap test takes a sample of cells from your cervix to check for cancer and HPV infection.  An HPV test. This is similar to a Pap test and involves taking a sample of cells from your cervix.  Using a scope to view the cervix (colposcopy). This may be done if a pelvic exam or Pap test is abnormal. A sample of tissue may be removed for testing (biopsy) during the colposcopy.  Currently, there is no test to detect HPV in males. How is this treated?  There is no treatment for the virus itself. However, there are treatments for the health problems and symptoms HPV can cause. Your health care provider will monitor you closely after you are treated as HPV can come back and may need treatment again. Treatment for HPV may include:  Medicines, which may be injected or applied to genital warts in a cream, lotion, liquid or gel form.  Use of a probe to apply extreme cold (cryotherapy) to the genital warts.  Application of an  intense beam of light (laser treatment) on the genital warts.  Use of a probe to apply extreme heat (electrocautery) on the genital warts.  Surgery to remove the genital warts.  Follow these instructions at home: Medicines  Take over-the-counter and prescription medicines only as told by your health care provider. This include creams for itching or irritation.  Do not treat genital warts with medicines used for treating hand warts. General instructions  Do not touch or scratch the warts.  Do not have sex while you are being treated.  Do not douche or use tampons during treatment (women).  Tell your sex partner about your infection. He or she may also need to be treated.  If you become pregnant, tell your health care provider that you have HPV. Your health care provider will monitor you closely during pregnancy to make sure your baby is safe.  Keep all follow-up visits as told by your health care provider. This is important. How is this prevented?  Talk with your health care provider about getting the HPV vaccines. These vaccines prevent some HPV infections and cancers. The vaccines are recommended for males and females between the ages of 49 and 69. They will not work if you already have HPV, and they are not recommended for pregnant women.  After treatment, use condoms during sex to prevent future infections.  Have only one sex partner.  Have a sex partner who does not have other sex partners.  Get regular Pap tests as directed by your health care provider. Contact a health care provider if:  The treated skin becomes red, swollen, or painful.  You have a fever.  You feel generally ill.  You feel lumps or pimples sticking out in and around your genital area.  You develop bleeding of the vagina or the treatment area.  You have painful sexual intercourse. Summary  Human papillomavirus (HPV) is the most common sexually transmitted infection (STI) and is highly  contagious.  Most people carrying HPV do not have any symptoms.  HPV can be prevented with vaccination. The vaccine is recommended for males and females between the ages of 61 and 52.  There is no treatment for the virus itself. However, there are treatments for the health problems and symptoms HPV can cause. This information is not intended to replace advice given to you by your health care provider. Make sure you discuss any questions you have with your health care provider. Document Released: 06/28/2003 Document Revised: 03/16/2016 Document Reviewed: 03/16/2016 Elsevier Interactive Patient Education  [employer].

## 2017-11-28 LAB — LIPID PANEL
Cholesterol: 156 mg/dL (ref <200)
HDL: 70 mg/dL (ref >50)
LDL Cholesterol (Calc): 72 mg/dL (calc)
Non-HDL Cholesterol (Calc): 86 mg/dL (calc) (ref <130)
Total CHOL/HDL Ratio: 2.2 (calc) (ref <5.0)
Triglycerides: 55 mg/dL (ref <150)

## 2017-11-28 LAB — CBC WITH DIFFERENTIAL/PLATELET
Basophils Absolute: 61 cells/uL (ref 0–200)
Basophils Relative: 1.6 %
Eosinophils Absolute: 87 cells/uL (ref 15–500)
Eosinophils Relative: 2.3 %
HCT: 42.2 % (ref 35.0–45.0)
Hemoglobin: 13.5 g/dL (ref 11.7–15.5)
Lymphs Abs: 1300 cells/uL (ref 850–3900)
MCH: 31.2 pg (ref 27.0–33.0)
MCHC: 32 g/dL (ref 32.0–36.0)
MCV: 97.5 fL (ref 80.0–100.0)
MPV: 11.4 fL (ref 7.5–12.5)
Monocytes Relative: 11 %
Neutro Abs: 1934 cells/uL (ref 1500–7800)
Neutrophils Relative %: 50.9 %
Platelets: 158 10*3/uL (ref 140–400)
RBC: 4.33 10*6/uL (ref 3.80–5.10)
RDW: 13.1 % (ref 11.0–15.0)
Total Lymphocyte: 34.2 %
WBC mixed population: 418 cells/uL (ref 200–950)
WBC: 3.8 10*3/uL (ref 3.8–10.8)

## 2017-11-28 LAB — COMPLETE METABOLIC PANEL WITH GFR
AG Ratio: 1.6 (calc) (ref 1.0–2.5)
ALT: 13 U/L (ref 6–29)
AST: 16 U/L (ref 10–30)
Albumin: 4.5 g/dL (ref 3.6–5.1)
Alkaline phosphatase (APISO): 56 U/L (ref 33–115)
BUN: 12 mg/dL (ref 7–25)
CO2: 28 mmol/L (ref 20–32)
Calcium: 9.9 mg/dL (ref 8.6–10.2)
Chloride: 103 mmol/L (ref 98–110)
Creat: 0.75 mg/dL (ref 0.50–1.10)
GFR, Est African American: 116 mL/min/{1.73_m2} (ref >=60)
GFR, Est Non African American: 100 mL/min/{1.73_m2} (ref >=60)
Globulin: 2.8 g/dL (calc) (ref 1.9–3.7)
Glucose, Bld: 83 mg/dL (ref 65–99)
Potassium: 4.3 mmol/L (ref 3.5–5.3)
Sodium: 138 mmol/L (ref 135–146)
Total Bilirubin: 0.6 mg/dL (ref 0.2–1.2)
Total Protein: 7.3 g/dL (ref 6.1–8.1)

## 2017-11-28 LAB — HEMOGLOBIN A1C
Hgb A1c MFr Bld: 5.3 % of total Hgb (ref <5.7)
Mean Plasma Glucose: 105 (calc)
eAG (mmol/L): 5.8 (calc)

## 2017-11-28 LAB — TSH: TSH: 0.61 mIU/L

## 2017-12-17 DIAGNOSIS — L821 Other seborrheic keratosis: Secondary | ICD-10-CM | POA: Diagnosis not present

## 2018-11-30 ENCOUNTER — Ambulatory Visit (INDEPENDENT_AMBULATORY_CARE_PROVIDER_SITE_OTHER): Payer: BC Managed Care – PPO | Admitting: Nurse Practitioner

## 2018-11-30 ENCOUNTER — Encounter: Payer: Self-pay | Admitting: Nurse Practitioner

## 2018-11-30 ENCOUNTER — Other Ambulatory Visit: Payer: Self-pay

## 2018-11-30 VITALS — BP 108/63 | HR 77 | Ht 69.0 in | Wt 138.0 lb

## 2018-11-30 DIAGNOSIS — Z23 Encounter for immunization: Secondary | ICD-10-CM | POA: Diagnosis not present

## 2018-11-30 DIAGNOSIS — Z0000 Encounter for general adult medical examination without abnormal findings: Secondary | ICD-10-CM

## 2018-11-30 DIAGNOSIS — Y991 Military activity: Secondary | ICD-10-CM

## 2018-11-30 NOTE — Patient Instructions (Addendum)
Lori Moran,   Thank you for coming in to clinic today.  1. You will be due for FASTING BLOOD WORK.  This means you should eat no food or drink after midnight.  Drink only water or coffee without cream/sugar on the morning of your lab visit. - Please go ahead and schedule a "Lab Only" visit in the morning at the clinic for lab draw in the next 7 days. - Your results will be available about 2-3 days after blood draw.  If you have set up a MyChart account, you can can log in to MyChart online to view your results and a brief explanation. Also, we can discuss your results together at your next office visit if you would like.  2. Good luck with your new adventure with the [place] [employer].  Let us know if you need anything else for your pre-acceptance forms.  Please schedule a follow-up appointment with Cassell Smiles, AGNP. Return in about 1 year (around 11/30/2019) for annual physical.  If you have any other questions or concerns, please feel free to call the clinic or send a message through [place]. You may also schedule an earlier appointment if necessary.  You will receive a survey after today's visit either digitally by e-mail or paper by [employer]. Your experiences and feedback matter to [place].  Please respond so we know how we are doing as we provide care for you.   Cassell Smiles, DNP, AGNP-BC Adult Gerontology Nurse Practitioner [place]

## 2018-11-30 NOTE — Progress Notes (Signed)
Subjective:    Patient ID: Lori Moran, female    DOB: [birth year], 43 y.o.   MRN: [id number]  Lori Moran is a 43 y.o. female presenting on 11/30/2018 for Annual Exam   HPI Annual Physical Exam Patient has been feeling well.  They have no acute concerns today. Sleeps approx 8 hours per night uninterrupted.  HEALTH MAINTENANCE: Weight/BMI: healthy Physical activity: regular Diet: Regular Seatbelt: always Sunscreen: regularly PAP: 06/2016 - patient defers to next visit despite positive HPV status and recommendation for annual PAP Mammogram: opts to wait for age 67 HIV: Due Optometry: regular Dentistry: regular  VACCINES: Tetanus: due Influenza: recommend   Past Medical History:  Diagnosis Date  . 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 NO PAST SURGERIES     Social History   Socioeconomic History  . Marital status: Married    Spouse name: Not on file  . Number of children: Not on file  . Years of education: Not on file  . Highest education level: Not on file  Occupational History  . Not on file  Social Needs  . Financial resource strain: Not on file  . Food insecurity    Worry: Not on file    Inability: Not on file  . Transportation needs    Medical: Not on file    Non-medical: Not on file  Tobacco Use  . Smoking status: Never Smoker  . Smokeless tobacco: Never Used  Substance and Sexual Activity  . Alcohol use: No    Alcohol/week: 0.0 standard drinks  . Drug use: No  . Sexual activity: Yes    Birth control/protection: None    Comment: partner had vasectomy  Lifestyle  . Physical activity    Days per week: Not on file    Minutes per session: Not on file  . Stress: Not on file  Relationships  . Social Herbalist on phone: Not on file    Gets together: Not on file    Attends religious service: Not on file    Active member of club or organization: Not on file     Attends meetings of clubs or organizations: Not on file    Relationship status: Not on file  . Intimate partner violence    Fear of current or ex partner: Not on file    Emotionally abused: Not on file    Physically abused: Not on file    Forced sexual activity: Not on file  Other Topics Concern  . Not on file  Social History Narrative  . Not on file   Family History  Problem Relation Age of Onset  . Healthy Mother   . Heart disease Paternal Grandmother   . Diabetes Paternal Grandmother    No current outpatient medications on file prior to visit.   No current facility-administered medications on file prior to visit.     Review of Systems  Constitutional: Negative for chills and fever.  HENT: Negative for congestion and sore throat.   Eyes: Negative for pain.  Respiratory: Negative for cough, shortness of breath and wheezing.   Cardiovascular: Negative for chest pain, palpitations and leg swelling.  Gastrointestinal: Negative for abdominal pain, blood in stool, constipation, diarrhea, nausea and vomiting.  Endocrine: Negative for polydipsia.  Genitourinary: Negative for dysuria, frequency, hematuria and urgency.  Musculoskeletal: Negative for back pain, myalgias and neck pain.  Skin: Negative.  Negative  for rash.  Allergic/Immunologic: Negative for environmental allergies.  Neurological: Negative for dizziness, weakness and headaches.  Hematological: Does not bruise/bleed easily.  Psychiatric/Behavioral: Negative for dysphoric mood and suicidal ideas. The patient is not nervous/anxious.    Per HPI unless specifically indicated above     Objective:    BP 108/63 (BP Location: Right Arm, Patient Position: Sitting, Cuff Size: Normal)   Pulse 77   Ht 5\' 9"  (1.753 m)   Wt 138 lb (62.6 kg)   BMI 20.38 kg/m   Wt Readings from Last 3 Encounters:  11/30/18 138 lb (62.6 kg)  11/27/17 139 lb (63 kg)  03/18/17 138 lb 4.8 oz (62.7 kg)    Physical Exam Vitals signs and  nursing note reviewed.  Constitutional:      General: She is not in acute distress.    Appearance: Normal appearance. She is well-developed and normal weight.  HENT:     Head: Normocephalic and atraumatic.     Right Ear: Tympanic membrane, ear canal and external ear normal.     Left Ear: Tympanic membrane, ear canal and external ear normal.     Nose: Nose normal.     Mouth/Throat:     Mouth: Mucous membranes are moist.     Pharynx: Oropharynx is clear.  Eyes:     Conjunctiva/sclera: Conjunctivae normal.     Pupils: Pupils are equal, round, and reactive to light.  Neck:     Musculoskeletal: Normal range of motion and neck supple.     Thyroid: No thyromegaly.     Vascular: No JVD.     Trachea: No tracheal deviation.  Cardiovascular:     Rate and Rhythm: Normal rate and regular rhythm.     Pulses: Normal pulses.     Heart sounds: Normal heart sounds. No murmur. No friction rub. No gallop.   Pulmonary:     Effort: Pulmonary effort is normal. No respiratory distress.     Breath sounds: Normal breath sounds.  Chest:     Comments: Patient deferred today. Abdominal:     General: Bowel sounds are normal. There is no distension.     Palpations: Abdomen is soft.     Tenderness: There is no abdominal tenderness.  Genitourinary:    Comments: Patient deferred today. Musculoskeletal: Normal range of motion.  Lymphadenopathy:     Cervical: No cervical adenopathy.  Skin:    General: Skin is warm and dry.     Capillary Refill: Capillary refill takes less than 2 seconds.  Neurological:     General: No focal deficit present.     Mental Status: She is alert and oriented to person, place, and time. Mental status is at baseline.     Cranial Nerves: No cranial nerve deficit.  Psychiatric:        Mood and Affect: Mood normal.        Behavior: Behavior normal.        Thought Content: Thought content normal.        Judgment: Judgment normal.    11/30/18 EKG: NSR  HR 65bpm QRS 53ms     PR  139ms QT 372ms   QTc 333ms    Results for orders placed or performed in visit on 11/30/18  HIV Antibody (routine testing w rflx)  Result Value Ref Range   HIV 1&2 Ab, 4th Generation NON-REACTIVE NON-REACTI  Lipid panel  Result Value Ref Range   Cholesterol 166 <200 mg/dL   HDL 71 > OR = 50 mg/dL  Triglycerides 49 <150 mg/dL   LDL Cholesterol (Calc) 81 mg/dL (calc)   Total CHOL/HDL Ratio 2.3 <5.0 (calc)   Non-HDL Cholesterol (Calc) 95 <130 mg/dL (calc)  COMPLETE METABOLIC PANEL WITH GFR  Result Value Ref Range   Glucose, Bld 94 65 - 99 mg/dL   BUN 12 7 - 25 mg/dL   Creat 0.87 0.50 - 1.10 mg/dL   GFR, Est Non African American 83 > OR = 60 mL/min/1.33m2   GFR, Est African American 96 > OR = 60 mL/min/1.37m2   BUN/Creatinine Ratio NOT APPLICABLE 6 - 22 (calc)   Sodium 139 135 - 146 mmol/L   Potassium 4.1 3.5 - 5.3 mmol/L   Chloride 105 98 - 110 mmol/L   CO2 26 20 - 32 mmol/L   Calcium 10.2 8.6 - 10.2 mg/dL   Total Protein 7.6 6.1 - 8.1 g/dL   Albumin 4.6 3.6 - 5.1 g/dL   Globulin 3.0 1.9 - 3.7 g/dL (calc)   AG Ratio 1.5 1.0 - 2.5 (calc)   Total Bilirubin 0.9 0.2 - 1.2 mg/dL   Alkaline phosphatase (APISO) 45 31 - 125 U/L   AST 20 10 - 30 U/L   ALT 13 6 - 29 U/L  CBC with Differential/Platelet  Result Value Ref Range   WBC 4.9 3.8 - 10.8 Thousand/uL   RBC 4.30 3.80 - 5.10 Million/uL   Hemoglobin 13.7 11.7 - 15.5 g/dL   HCT 42.1 35.0 - 45.0 %   MCV 97.9 80.0 - 100.0 fL   MCH 31.9 27.0 - 33.0 pg   MCHC 32.5 32.0 - 36.0 g/dL   RDW 12.9 11.0 - 15.0 %   Platelets 173 140 - 400 Thousand/uL   MPV 12.1 7.5 - 12.5 fL   Neutro Abs 3,205 1,500 - 7,800 cells/uL   Lymphs Abs 1,054 850 - 3,900 cells/uL   Absolute Monocytes 490 200 - 950 cells/uL   Eosinophils Absolute 113 15 - 500 cells/uL   Basophils Absolute 39 0 - 200 cells/uL   Neutrophils Relative % 65.4 %   Total Lymphocyte 21.5 %   Monocytes Relative 10.0 %   Eosinophils Relative 2.3 %   Basophils Relative 0.8 %   Hemoglobin A1c  Result Value Ref Range   Hgb A1c MFr Bld 5.2 <5.7 % of total Hgb   Mean Plasma Glucose 103 (calc)   eAG (mmol/L) 5.7 (calc)  TSH  Result Value Ref Range   TSH 0.58 mIU/L  T4, free  Result Value Ref Range   Free T4 1.2 0.8 - 1.8 ng/dL      Assessment & Plan:   Problem List Items Addressed This Visit    None    Visit Diagnoses    Encounter for annual physical exam    -  Primary   Relevant Orders   EKG 12-Lead   HIV Antibody (routine testing w rflx) (Completed)   Lipid panel (Completed)   COMPLETE METABOLIC PANEL WITH GFR (Completed)   CBC with Differential/Platelet (Completed)   Hemoglobin A1c (Completed)   TSH (Completed)   T4, free (Completed)   Need for Tdap vaccination       Relevant Orders   Tdap vaccine greater than or equal to 7yo IM (Completed)   Military activity          Annual physical exam without new findings.  Well adult with no acute concerns.  Physical requirements needed to meet pre-employment screenings for [employer].  Add EKG to satisfy.  Plan: 1. Obtain health  maintenance screenings as above according to age. - Increase physical activity to 30 minutes most days of the week.  - Eat healthy diet high in vegetables and fruits; low in refined carbohydrates. - Screening labs and tests as ordered - EKG per request of [employer] medical screenings. 2. Return 1 year for annual physical, PAP in March 2021.    Follow up plan: Return in about 1 year (around 11/30/2019) for annual physical.  Cassell Smiles, DNP, AGPCNP-BC Adult Gerontology Primary Care Nurse Practitioner [facility] 11/30/2018, 3:13 PM

## 2018-12-01 ENCOUNTER — Encounter: Payer: Self-pay | Admitting: Nurse Practitioner

## 2018-12-01 DIAGNOSIS — Z0000 Encounter for general adult medical examination without abnormal findings: Secondary | ICD-10-CM | POA: Diagnosis not present

## 2018-12-02 LAB — CBC WITH DIFFERENTIAL/PLATELET
Absolute Monocytes: 490 cells/uL (ref 200–950)
Basophils Absolute: 39 cells/uL (ref 0–200)
Basophils Relative: 0.8 %
Eosinophils Absolute: 113 cells/uL (ref 15–500)
Eosinophils Relative: 2.3 %
HCT: 42.1 % (ref 35.0–45.0)
Hemoglobin: 13.7 g/dL (ref 11.7–15.5)
Lymphs Abs: 1054 cells/uL (ref 850–3900)
MCH: 31.9 pg (ref 27.0–33.0)
MCHC: 32.5 g/dL (ref 32.0–36.0)
MCV: 97.9 fL (ref 80.0–100.0)
MPV: 12.1 fL (ref 7.5–12.5)
Monocytes Relative: 10 %
Neutro Abs: 3205 cells/uL (ref 1500–7800)
Neutrophils Relative %: 65.4 %
Platelets: 173 10*3/uL (ref 140–400)
RBC: 4.3 10*6/uL (ref 3.80–5.10)
RDW: 12.9 % (ref 11.0–15.0)
Total Lymphocyte: 21.5 %
WBC: 4.9 10*3/uL (ref 3.8–10.8)

## 2018-12-02 LAB — COMPLETE METABOLIC PANEL WITH GFR
AG Ratio: 1.5 (calc) (ref 1.0–2.5)
ALT: 13 U/L (ref 6–29)
AST: 20 U/L (ref 10–30)
Albumin: 4.6 g/dL (ref 3.6–5.1)
Alkaline phosphatase (APISO): 45 U/L (ref 31–125)
BUN: 12 mg/dL (ref 7–25)
CO2: 26 mmol/L (ref 20–32)
Calcium: 10.2 mg/dL (ref 8.6–10.2)
Chloride: 105 mmol/L (ref 98–110)
Creat: 0.87 mg/dL (ref 0.50–1.10)
GFR, Est African American: 96 mL/min/{1.73_m2} (ref >=60)
GFR, Est Non African American: 83 mL/min/{1.73_m2} (ref >=60)
Globulin: 3 g/dL (calc) (ref 1.9–3.7)
Glucose, Bld: 94 mg/dL (ref 65–99)
Potassium: 4.1 mmol/L (ref 3.5–5.3)
Sodium: 139 mmol/L (ref 135–146)
Total Bilirubin: 0.9 mg/dL (ref 0.2–1.2)
Total Protein: 7.6 g/dL (ref 6.1–8.1)

## 2018-12-02 LAB — LIPID PANEL
Cholesterol: 166 mg/dL (ref <200)
HDL: 71 mg/dL (ref >=50)
LDL Cholesterol (Calc): 81 mg/dL (calc)
Non-HDL Cholesterol (Calc): 95 mg/dL (calc) (ref <130)
Total CHOL/HDL Ratio: 2.3 (calc) (ref <5.0)
Triglycerides: 49 mg/dL (ref <150)

## 2018-12-02 LAB — TSH: TSH: 0.58 mIU/L

## 2018-12-02 LAB — HEMOGLOBIN A1C
Hgb A1c MFr Bld: 5.2 % of total Hgb (ref <5.7)
Mean Plasma Glucose: 103 (calc)
eAG (mmol/L): 5.7 (calc)

## 2018-12-02 LAB — T4, FREE: Free T4: 1.2 ng/dL (ref 0.8–1.8)

## 2018-12-02 LAB — HIV ANTIBODY (ROUTINE TESTING W REFLEX): HIV 1&2 Ab, 4th Generation: NONREACTIVE

## 2018-12-03 ENCOUNTER — Encounter: Payer: Self-pay | Admitting: Nurse Practitioner

## 2018-12-03 DIAGNOSIS — Z3181 Encounter for male factor infertility in female patient: Secondary | ICD-10-CM | POA: Diagnosis not present

## 2018-12-03 DIAGNOSIS — N978 Female infertility of other origin: Secondary | ICD-10-CM | POA: Diagnosis not present

## 2018-12-21 ENCOUNTER — Encounter: Payer: Self-pay | Admitting: Nurse Practitioner

## 2019-01-17 ENCOUNTER — Encounter: Payer: Self-pay | Admitting: Certified Nurse Midwife

## 2019-01-17 ENCOUNTER — Encounter: Payer: BC Managed Care – PPO | Admitting: Certified Nurse Midwife

## 2019-01-24 ENCOUNTER — Ambulatory Visit (INDEPENDENT_AMBULATORY_CARE_PROVIDER_SITE_OTHER): Payer: BC Managed Care – PPO | Admitting: Certified Nurse Midwife

## 2019-01-24 ENCOUNTER — Encounter: Payer: Self-pay | Admitting: Certified Nurse Midwife

## 2019-01-24 ENCOUNTER — Other Ambulatory Visit: Payer: Self-pay

## 2019-01-24 ENCOUNTER — Ambulatory Visit: Payer: BC Managed Care – PPO

## 2019-01-24 ENCOUNTER — Other Ambulatory Visit (HOSPITAL_COMMUNITY)
Admission: RE | Admit: 2019-01-24 | Discharge: 2019-01-24 | Disposition: A | Discharge status: Home / Self Care | Payer: BC Managed Care – PPO | Source: Ambulatory Visit | Attending: Certified Nurse Midwife | Admitting: Certified Nurse Midwife

## 2019-01-24 VITALS — BP 112/68 | HR 54 | Ht 69.0 in | Wt 145.4 lb

## 2019-01-24 DIAGNOSIS — Z01419 Encounter for gynecological examination (general) (routine) without abnormal findings: Secondary | ICD-10-CM

## 2019-01-24 DIAGNOSIS — Z124 Encounter for screening for malignant neoplasm of cervix: Secondary | ICD-10-CM

## 2019-01-24 DIAGNOSIS — R87810 Cervical high risk human papillomavirus (HPV) DNA test positive: Secondary | ICD-10-CM | POA: Insufficient documentation

## 2019-01-24 DIAGNOSIS — Z1231 Encounter for screening mammogram for malignant neoplasm of breast: Secondary | ICD-10-CM | POA: Diagnosis not present

## 2019-01-24 NOTE — Progress Notes (Signed)
Patient here for annual exam, no complaints.  

## 2019-01-24 NOTE — Patient Instructions (Addendum)
Preventive Care 40-41 Years Old, Female °Preventive care refers to visits with your health care provider and lifestyle choices that can promote health and wellness. This includes: °· A yearly physical exam. This may also be called an annual well check. °· Regular dental visits and eye exams. °· Immunizations. °· Screening for certain conditions. °· Healthy lifestyle choices, such as eating a healthy diet, getting regular exercise, not using drugs or products that contain nicotine and tobacco, and limiting alcohol use. °What can I expect for my preventive care visit? °Physical exam °Your health care provider will check your: °· Height and weight. This may be used to calculate body mass index (BMI), which tells if you are at a healthy weight. °· Heart rate and blood pressure. °· Skin for abnormal spots. °Counseling °Your health care provider may ask you questions about your: °· Alcohol, tobacco, and drug use. °· Emotional well-being. °· Home and relationship well-being. °· Sexual activity. °· Eating habits. °· Work and work environment. °· Method of birth control. °· Menstrual cycle. °· Pregnancy history. °What immunizations do I need? ° °Influenza (flu) vaccine °· This is recommended every year. °Tetanus, diphtheria, and pertussis (Tdap) vaccine °· You may need a Td booster every 10 years. °Varicella (chickenpox) vaccine °· You may need this if you have not been vaccinated. °Zoster (shingles) vaccine °· You may need this after age 60. °Measles, mumps, and rubella (MMR) vaccine °· You may need at least one dose of MMR if you were born in 1957 or later. You may also need a second dose. °Pneumococcal conjugate (PCV13) vaccine °· You may need this if you have certain conditions and were not previously vaccinated. °Pneumococcal polysaccharide (PPSV23) vaccine °· You may need one or two doses if you smoke cigarettes or if you have certain conditions. °Meningococcal conjugate (MenACWY) vaccine °· You may need this if you  have certain conditions. °Hepatitis A vaccine °· You may need this if you have certain conditions or if you travel or work in places where you may be exposed to hepatitis A. °Hepatitis B vaccine °· You may need this if you have certain conditions or if you travel or work in places where you may be exposed to hepatitis B. °Haemophilus influenzae type b (Hib) vaccine °· You may need this if you have certain conditions. °Human papillomavirus (HPV) vaccine °· If recommended by your health care provider, you may need three doses over 6 months. °You may receive vaccines as individual doses or as more than one vaccine together in one shot (combination vaccines). Talk with your health care provider about the risks and benefits of combination vaccines. °What tests do I need? °Blood tests °· Lipid and cholesterol levels. These may be checked every 5 years, or more frequently if you are over 50 years old. °· Hepatitis C test. °· Hepatitis B test. °Screening °· Lung cancer screening. You may have this screening every year starting at age 55 if you have a 30-pack-year history of smoking and currently smoke or have quit within the past 15 years. °· Colorectal cancer screening. All adults should have this screening starting at age 50 and continuing until age 75. Your health care provider may recommend screening at age 45 if you are at increased risk. You will have tests every 1-10 years, depending on your results and the type of screening test. °· Diabetes screening. This is done by checking your blood sugar (glucose) after you have not eaten for a while (fasting). You may have this   done every 1-3 years.  Mammogram. This may be done every 1-2 years. Talk with your health care provider about when you should start having regular mammograms. This may depend on whether you have a family history of breast cancer.  BRCA-related cancer screening. This may be done if you have a family history of breast, ovarian, tubal, or peritoneal  cancers.  Pelvic exam and Pap test. This may be done every 3 years starting at age 60. Starting at age 66, this may be done every 5 years if you have a Pap test in combination with an HPV test. Other tests  Sexually transmitted disease (STD) testing.  Bone density scan. This is done to screen for osteoporosis. You may have this scan if you are at high risk for osteoporosis. Follow these instructions at home: Eating and drinking  Eat a diet that includes fresh fruits and vegetables, whole grains, lean protein, and low-fat dairy.  Take vitamin and mineral supplements as recommended by your health care provider.  Do not drink alcohol if: ? Your health care provider tells you not to drink. ? You are pregnant, may be pregnant, or are planning to become pregnant.  If you drink alcohol: ? Limit how much you have to 0-1 drink a day. ? Be aware of how much alcohol is in your drink. In the [place] one drink equals one 12 oz bottle of beer (355 mL), one 5 oz glass of wine (148 mL), or one 1 oz glass of hard liquor (44 mL). Lifestyle  Take daily care of your teeth and gums.  Stay active. Exercise for at least 30 minutes on 5 or more days each week.  Do not use any products that contain nicotine or tobacco, such as cigarettes, e-cigarettes, and chewing tobacco. If you need help quitting, ask your health care provider.  If you are sexually active, practice safe sex. Use a condom or other form of birth control (contraception) in order to prevent pregnancy and STIs (sexually transmitted infections).  If told by your health care provider, take low-dose aspirin daily starting at age 43. What's next?  Visit your health care provider once a year for a well check visit.  Ask your health care provider how often you should have your eyes and teeth checked.  Stay up to date on all vaccines. This information is not intended to replace advice given to you by your health care provider. Make sure you  discuss any questions you have with your health care provider. Document Released: 05/04/2015 Document Revised: 12/17/2017 Document Reviewed: 12/17/2017 Elsevier Patient Education  [place].   HPV Test Why am I having this test? HPV (human papillomavirus) refers to a group of about 100 viruses. Many of these viruses cause growths on, in, or around the genitals. Most HPV viruses cause infections that usually go away without treatment.  The HPV test checks for high-risk types (strains) of HPV. Strains 16 and 18 are considered the most high-risk for cancer. If you have strain 16 or 18 HPV and it is not treated, it can increase your risk for cancer of the cervix, vagina, vulva, or anus. HPV can be found in both males and females. However, the HPV test is used to screen for increased cancer risk in females:  Who are 72-58 years old.  Who have an abnormal Pap test.  Who have been treated for an abnormal Pap test in the past.  Who have been treated for a high-risk HPV infection in the past.  If you are a woman older than 30, you may have the HPV test at the same time as a pelvic exam and Pap test. What is being tested? This test checks for the DNA (genetic) strands of the HPV infection. This test is also called the HPV DNA test. What kind of sample is taken?  This test requires a sample of cells from the cervix. This will be done using a small cotton swab, plastic spatula, or brush. This sample is often collected during a pelvic exam, when you are lying on your back on an exam table with feet in footrests (stirrups). How do I prepare for this test?  Starting 24-48 hours before your test, or as told by your health care provider, do not: ? Take a bath. ? Have sex. ? Douche.  Schedule the test for a day when you are not menstruating. If you are menstruating on the day of the test, you may need to reschedule.  You will be asked to urinate right before the test. How are the results  reported? Your test results will be reported as either positive or negative for HPV. If you have a positive result, the results will indicate which HPV strain you are positive for. What do the results mean? A negative HPV test result means that no HPV was found. This means it is very likely that you do not have HPV. A positive HPV test result means that you have HPV.  If your results show the presence of any high-risk strains, you may have a higher risk of developing anal or cervical cancer if your infection is not treated.  If any low-risk HPV strains are found, it is not likely that you have an increased risk for cancer. Talk with your health care provider about what your results mean. Questions to ask your health care provider Ask your health care provider, or the department that is doing the test:  When will my results be ready?  How will I get my results?  What are my treatment options?  What other tests do I need?  What are my next steps? Summary  The human papillomavirus (HPV) test is used to look for high-risk types of HPV infection. This test is done only for females.  HPV types 16 and 18 are considered high-risk types of HPV. If untreated, these types of infections increase your risk for cancer of the cervix or anus.  A negative HPV test result means that no HPV was found, and it is very likely that you do not have HPV.  A positive HPV test result means that you have an HPV infection. This information is not intended to replace advice given to you by your health care provider. Make sure you discuss any questions you have with your health care provider. Document Released: 05/02/2004 Document Revised: 03/20/2017 Document Reviewed: 02/17/2017 Elsevier Patient Education  2020 [employer].

## 2019-01-24 NOTE — Progress Notes (Addendum)
ANNUAL PREVENTATIVE CARE GYN  ENCOUNTER NOTE  Subjective:       Lori Moran is a 43 y.o. G66P0010 female here for a routine annual gynecologic exam.  Current complaints: 1. Requests physical for the [facility] 2. Needs Pap smear 3. Desires screening mammogram  Denies difficulty breathing or respiratory distress, chest pain, abdominal pain, excessive vaginal bleeding, dysuria, and leg pain or swelling.    Gynecologic History  Patient's last menstrual period was 01/17/2019 (exact date). Period Cycle (Days): 28 Period Duration (Days): 5 Period Pattern: Regular Menstrual Flow: Moderate Menstrual Control: Thin pad, Maxi pad Dysmenorrhea: None  Contraception: vasectomy  Last Pap: 06/2016. Results were: Negative/HPV positive  Last mammogram: due.  Obstetric History  OB History  Gravida Para Term Preterm AB Living  1       1    SAB TAB Ectopic Multiple Live Births  1            # Outcome Date GA Lbr Len/2nd Weight Sex Delivery Anes PTL Lv  1 SAB 2011            Past Medical History:  Diagnosis Date  . 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 NO PAST SURGERIES     No Known Allergies  Social History   Socioeconomic History  . Marital status: Married    Spouse name: Not on file  . Number of children: Not on file  . Years of education: Not on file  . Highest education level: Not on file  Occupational History  . Not on file  Social Needs  . Financial resource strain: Not on file  . Food insecurity    Worry: Not on file    Inability: Not on file  . Transportation needs    Medical: Not on file    Non-medical: Not on file  Tobacco Use  . Smoking status: Never Smoker  . Smokeless tobacco: Never Used  Substance and Sexual Activity  . Alcohol use: No    Alcohol/week: 0.0 standard drinks  . Drug use: No  . Sexual activity: Yes    Birth control/protection: Other-see comments   Comment: partner had vasectomy  Lifestyle  . Physical activity    Days per week: Not on file    Minutes per session: Not on file  . Stress: Not on file  Relationships  . Social Herbalist on phone: Not on file    Gets together: Not on file    Attends religious service: Not on file    Active member of club or organization: Not on file    Attends meetings of clubs or organizations: Not on file    Relationship status: Not on file  . Intimate partner violence    Fear of current or ex partner: Not on file    Emotionally abused: Not on file    Physically abused: Not on file    Forced sexual activity: Not on file  Other Topics Concern  . Not on file  Social History Narrative  . Not on file    Family History  Problem Relation Age of Onset  . Healthy Mother   . Heart disease Paternal Grandmother   . Diabetes Paternal Grandmother   . Breast cancer Neg Hx   . Ovarian cancer Neg Hx   . Colon cancer Neg Hx     The following portions of the patient's history  were reviewed and updated as appropriate: allergies, current medications, past family history, past medical history, past social history, past surgical history and problem list.  Review of Systems  ROS negative except as noted above. Information obtained from patient.    Objective:   BP 112/68   Pulse (!) 54   Ht 5\' 9"  (1.753 m)   Wt 145 lb 6.4 oz (66 kg)   LMP 01/17/2019 (Exact Date)   BMI 21.47 kg/m   CONSTITUTIONAL: Well-developed, well-nourished female in no acute distress.   PSYCHIATRIC: Normal mood and affect. Normal behavior.  Normal judgment and thought content.  [place]: Alert and oriented to person, place, and time. Normal muscle tone coordination. No cranial nerve deficit noted.  HENT:  Normocephalic, atraumatic, External right and left ear normal.   EYES: Conjunctivae and EOM are normal. Pupils are equal and round.   NECK: Normal range of motion, supple, no masses.  Normal thyroid.   SKIN:  Skin is warm and dry. No rash noted. Not diaphoretic. No erythema. No pallor. Professional tattoos present.   CARDIOVASCULAR: Normal heart rate noted, regular rhythm, no murmur.  RESPIRATORY: Clear to auscultation bilaterally. Effort and breath sounds normal, no problems with respiration noted.  BREASTS: Symmetric in size. No masses, skin changes, nipple drainage, or lymphadenopathy.  ABDOMEN: Soft, normal bowel sounds, no distention noted.  No tenderness, rebound or guarding.   PELVIC:  External Genitalia: Normal  Vagina: Normal  Cervix: Normal, Pap collected  Uterus: Normal  Adnexa: Normal  MUSCULOSKELETAL: Normal range of motion. No tenderness.  No cyanosis, clubbing, or edema.  2+ distal pulses.  LYMPHATIC: No Axillary, Supraclavicular, or Inguinal Adenopathy.  Assessment:   Annual gynecologic examination 43 y.o.   Contraception: vasectomy   Normal BMI   Problem List Items Addressed This Visit    None    Visit Diagnoses    Well woman exam    -  Primary   Relevant Orders   Cytology - PAP   MM 3D SCREEN BREAST BILATERAL   Cervical high risk HPV (human papillomavirus) test positive       Relevant Orders   Cytology - PAP   Encounter for screening mammogram for breast cancer       Relevant Orders   MM 3D SCREEN BREAST BILATERAL      Plan:   Pap: Pap Co Test   Mammogram: Ordered   Labs: Declined   Routine preventative health maintenance measures emphasized: Exercise/Diet/Weight control, Tobacco Warnings, Alcohol/Substance use risks, Stress Management and Peer Pressure Issues; see AVS  Reviewed red flag symptoms and when to call  RTC x 1 year for ANNUAL EXAM or sooner if needed   Diona Fanti, CNM [facility], [facility] 01/24/19 10:31 AM

## 2019-01-25 DIAGNOSIS — Z3141 Encounter for fertility testing: Secondary | ICD-10-CM | POA: Diagnosis not present

## 2019-01-25 DIAGNOSIS — Z1159 Encounter for screening for other viral diseases: Secondary | ICD-10-CM | POA: Diagnosis not present

## 2019-01-31 LAB — CYTOLOGY - PAP
Diagnosis: NEGATIVE
High risk HPV: NEGATIVE

## 2019-02-10 ENCOUNTER — Ambulatory Visit
Admission: RE | Admit: 2019-02-10 | Discharge: 2019-02-10 | Disposition: A | Discharge status: Home / Self Care | Payer: BC Managed Care – PPO | Source: Ambulatory Visit | Attending: Certified Nurse Midwife | Admitting: Certified Nurse Midwife

## 2019-02-10 ENCOUNTER — Other Ambulatory Visit: Payer: Self-pay

## 2019-02-10 DIAGNOSIS — Z1231 Encounter for screening mammogram for malignant neoplasm of breast: Secondary | ICD-10-CM | POA: Diagnosis not present

## 2019-02-10 DIAGNOSIS — Z01419 Encounter for gynecological examination (general) (routine) without abnormal findings: Secondary | ICD-10-CM

## 2019-03-04 ENCOUNTER — Telehealth: Payer: Self-pay

## 2019-03-04 DIAGNOSIS — R011 Cardiac murmur, unspecified: Secondary | ICD-10-CM

## 2019-03-04 NOTE — Telephone Encounter (Signed)
Left a detail message on the patient voicemail concerning her referral that was placed to [facility]. The pt was notified that someone will be calling her to schedule the appt.

## 2019-03-04 NOTE — Telephone Encounter (Signed)
[facility] just [facility] [place] [employer] [facility] and they heard a heart murmer and would like patient to have this checked out by cardiology.  We have already completed a EKG and labs.  Can this referral be placed without another appt.

## 2019-03-04 NOTE — Telephone Encounter (Signed)
Please notify patient  She does not need another apt with [place].  I placed new referral to [facility] in [place]. Patient can stay tuned for appointment, they should call her w/ scheduling information.  Nobie Putnam, [facility] 03/04/2019, 12:44 PM

## 2019-03-07 ENCOUNTER — Encounter: Payer: Self-pay | Admitting: Cardiovascular Disease

## 2019-03-07 ENCOUNTER — Encounter: Payer: Self-pay | Admitting: *Deleted

## 2019-03-07 ENCOUNTER — Ambulatory Visit (INDEPENDENT_AMBULATORY_CARE_PROVIDER_SITE_OTHER): Payer: BC Managed Care – PPO | Admitting: Cardiovascular Disease

## 2019-03-07 ENCOUNTER — Other Ambulatory Visit: Payer: Self-pay

## 2019-03-07 VITALS — BP 108/70 | HR 60 | Ht 69.0 in | Wt 143.2 lb

## 2019-03-07 DIAGNOSIS — R012 Other cardiac sounds: Secondary | ICD-10-CM | POA: Diagnosis not present

## 2019-03-07 DIAGNOSIS — R9431 Abnormal electrocardiogram [ECG] [EKG]: Secondary | ICD-10-CM | POA: Diagnosis not present

## 2019-03-07 NOTE — Progress Notes (Signed)
Cardiology Office Note  Date:  03/07/2019   ID:  Lori Moran, DOB August 02, 1975, MRN [id number]  PCP:  Mikey College, NP (Inactive)   Chief Complaint  Patient presents with  . other    Heart Murmur cardiac clearance pt is wanting to enter [place] and is not sure if echo is really necessary no complaints today.  Pt refused EKG today. Meds reviewed verbally with pt.     HPI:  [name] is a 43 year old woman with  No significant prior cardiac history Presenting by referral from Dr. 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, Was felt her EKG was abnormal, and there was a murmur on exam accentuated after doing sit ups  At baseline has no significant shortness of breath or chest discomfort on exertion No prior cardiac history No significant family history of cardiac disease  Review of lab work shows total cholesterol 160, hemoglobin A1c normal 5.2 Non-smoker  EKG was personally reviewed by myself showing normal sinus rhythm no significant ST-T wave changes RsR is normal variant, should not be considered abnormal Overall EKG is normal   PMH:   has a past medical history of 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  PSH:    Past Surgical History:  Procedure Laterality Date  . NO PAST SURGERIES      No current outpatient medications on file.   No current facility-administered medications for this visit.     Allergies:   Patient has no known allergies.   Social History:  The patient  reports that she has never smoked. She has never used smokeless tobacco. She reports that she does not drink alcohol or use drugs.   Family History:   family history includes Diabetes in her paternal grandmother; Healthy in her mother; Heart disease in her paternal grandmother.    Review of Systems: Review of Systems  Constitutional: Negative.   Respiratory: Negative.   Cardiovascular:  Negative.   Gastrointestinal: Negative.   Musculoskeletal: Negative.   Neurological: Negative.   Psychiatric/Behavioral: Negative.   All other systems reviewed and are negative.    PHYSICAL EXAM: VS:  BP 108/70 (BP Location: Right Arm, Patient Position: Sitting, Cuff Size: Normal)   Pulse 60   Ht 5\' 9"  (1.753 m)   Wt 143 lb 4 oz (65 kg)   SpO2 98%   BMI 21.15 kg/m  , BMI Body mass index is 21.15 kg/m. GEN: Well nourished, well developed, in no acute distress HEENT: normal Neck: no JVD, carotid bruits, or masses Cardiac: RRR; no murmurs, rubs, or gallops,no edema  Respiratory:  clear to auscultation bilaterally, normal work of breathing GI: soft, nontender, nondistended, + BS MS: no deformity or atrophy Skin: warm and dry, no rash Neuro:  Strength and sensation are intact Psych: euthymic mood, full affect   Recent Labs: 12/01/2018: ALT 13; BUN 12; Creat 0.87; Hemoglobin 13.7; Platelets 173; Potassium 4.1; Sodium 139; TSH 0.58    Lipid Panel Lab Results  Component Value Date   CHOL 166 12/01/2018   HDL 71 12/01/2018   LDLCALC 81 12/01/2018   TRIG 49 12/01/2018      Wt Readings from Last 3 Encounters:  03/07/19 143 lb 4 oz (65 kg)  01/24/19 145 lb 6.4 oz (66 kg)  11/30/18 138 lb (62.6 kg)       ASSESSMENT AND PLAN:  Problem List Items Addressed This Visit    None    Visit  Diagnoses    Nonspecific abnormal electrocardiogram (ECG) (EKG)    -  Primary   Abnormal heart sounds          EKG as detailed above is normal RSR prime not an indication of an abnormality Would agree with the computer readout, normal finding, normal EKG  Heart sounds Extensive clinical exam today with no appreciable murmur,  low-grade 1/6 murmur also not appreciated Tried various maneuvers to elicit the murmur, Again no significant murmur appreciated This would indicate no significant aortic valve sclerosis, not high risk for bicuspid aortic valve, no evidence of mitral valve  prolapse or mitral valve regurgitation or tricuspid valve regurgitation Given no appreciable murmur, excellent aerobic tolerance, no further testing needed Echocardiogram is not indicated at this time  For any further questions please do not hesitate to contact our office  Disposition:   F/U as needed     Signed, Esmond Plants, M.D., Ph.D. [place], [place]

## 2019-03-07 NOTE — Patient Instructions (Signed)
Medication Instructions:  - Your physician recommends that you continue on your current medications as directed. Please refer to the Current Medication list given to you today.  *If you need a refill on your cardiac medications before your next appointment, please call your pharmacy*  Lab Work: - none ordered  If you have labs (blood work) drawn today and your tests are completely normal, you will receive your results only by: . MyChart Message (if you have MyChart) OR . A paper copy in the mail If you have any lab test that is abnormal or we need to change your treatment, we will call you to review the results.  Testing/Procedures: - none ordered  Follow-Up: At [facility], you and your health needs are our priority.  As part of our continuing mission to provide you with exceptional heart care, we have created designated Provider Care Teams.  These Care Teams include your primary Cardiologist (physician) and Advanced Practice Providers (APPs -  Physician Assistants and Nurse Practitioners) who all work together to provide you with the care you need, when you need it.  Your next appointment:   as needed  The format for your next appointment:   n/a  Provider:   n/a  Other Instructions n/a  

## 2019-05-24 DIAGNOSIS — Z3181 Encounter for male factor infertility in female patient: Secondary | ICD-10-CM | POA: Diagnosis not present

## 2019-05-24 DIAGNOSIS — N978 Female infertility of other origin: Secondary | ICD-10-CM | POA: Diagnosis not present

## 2019-06-21 DIAGNOSIS — N978 Female infertility of other origin: Secondary | ICD-10-CM | POA: Diagnosis not present

## 2019-06-21 DIAGNOSIS — Z3181 Encounter for male factor infertility in female patient: Secondary | ICD-10-CM | POA: Diagnosis not present

## 2019-06-25 DIAGNOSIS — Z3181 Encounter for male factor infertility in female patient: Secondary | ICD-10-CM | POA: Diagnosis not present

## 2019-06-25 DIAGNOSIS — N978 Female infertility of other origin: Secondary | ICD-10-CM | POA: Diagnosis not present

## 2019-06-27 DIAGNOSIS — Z3181 Encounter for male factor infertility in female patient: Secondary | ICD-10-CM | POA: Diagnosis not present

## 2019-06-27 DIAGNOSIS — N978 Female infertility of other origin: Secondary | ICD-10-CM | POA: Diagnosis not present

## 2019-06-29 DIAGNOSIS — Z3181 Encounter for male factor infertility in female patient: Secondary | ICD-10-CM | POA: Diagnosis not present

## 2019-06-29 DIAGNOSIS — N978 Female infertility of other origin: Secondary | ICD-10-CM | POA: Diagnosis not present

## 2019-07-01 DIAGNOSIS — N978 Female infertility of other origin: Secondary | ICD-10-CM | POA: Diagnosis not present

## 2019-07-01 DIAGNOSIS — Z20822 Contact with and (suspected) exposure to covid-19: Secondary | ICD-10-CM | POA: Diagnosis not present

## 2019-07-02 DIAGNOSIS — N979 Female infertility, unspecified: Secondary | ICD-10-CM | POA: Diagnosis not present

## 2019-09-01 ENCOUNTER — Telehealth: Payer: Self-pay | Admitting: Obstetrics and Gynecology

## 2019-09-01 NOTE — Telephone Encounter (Signed)
The Lori Moran came in today with no appt. The Lori Moran wanted to know if she was a good fit she just found out she's perganat and she wants to start care with dr. Marcelline Mates but she has some questions. She did stated she is high risk. The Lori Moran is requesting a call back. Please advise

## 2019-09-08 NOTE — Telephone Encounter (Signed)
Lori Moran, Please reach out to this pt and schedule her an appointment to be seen for pregnancy confirmation. Thanks [employer]

## 2019-09-09 ENCOUNTER — Telehealth: Payer: Self-pay | Admitting: Obstetrics and Gynecology

## 2019-09-09 NOTE — Telephone Encounter (Signed)
Pt called no answer LM via VM that we needed to schedule her for an appointment with [facility] for pregnancy confirmation/appointment with [facility] for OB visit. Pt was advised to please contact the office to schedule that appointment.

## 2019-09-09 NOTE — Telephone Encounter (Signed)
Patient called returning front desk phone call, and she said she didn't want to be scheduled for a pregnancy confirmation that she already knows she's pregnant. Patient states that she really just wants a phone call either from the nurse or Dr. Marcelline Mates. Patient isn't sure if she wants Dr. Marcelline Mates to be the provider whom gives her prenatal care. Could you please advise?

## 2019-09-09 NOTE — Telephone Encounter (Signed)
Called pt [facility] for appt.

## 2019-09-14 NOTE — Telephone Encounter (Signed)
Pt called no answer LM via VM to call the office to schedule an appointment to be seen by [facility].

## 2019-10-04 NOTE — Telephone Encounter (Signed)
Spoke to pt concerning her call to the office. Pt is not sure if she wanted to come to our office or stay at [facility] due to her having implanted eggs. Pt was informed that all the providers in the office are able to provide the best care for her and the baby. Pt stated that she would sent over information from her [facility] chart for providers to review to see if she could be seen in the office. Pt stated that she would need to speak to her husband to see if he is okay with her having the baby at [facility].

## 2019-10-12 NOTE — Telephone Encounter (Signed)
Pt called no answer LM via VM that AC stated that she was able to have a prenatal care done here at this practice. Pt was advised to call the office and schedule an appointment with [facility].

## 2020-01-25 ENCOUNTER — Encounter: Payer: BC Managed Care – PPO | Admitting: Obstetrics and Gynecology

## 2021-08-01 IMAGING — MG DIGITAL SCREENING BILAT W/ TOMO W/ CAD
8 series · 9 of 24 positions shown · non-contrast
Comparison: None.

CLINICAL DATA: Screening.

EXAM:
DIGITAL SCREENING BILATERAL MAMMOGRAM WITH TOMO AND CAD

[L CC synth-2D]
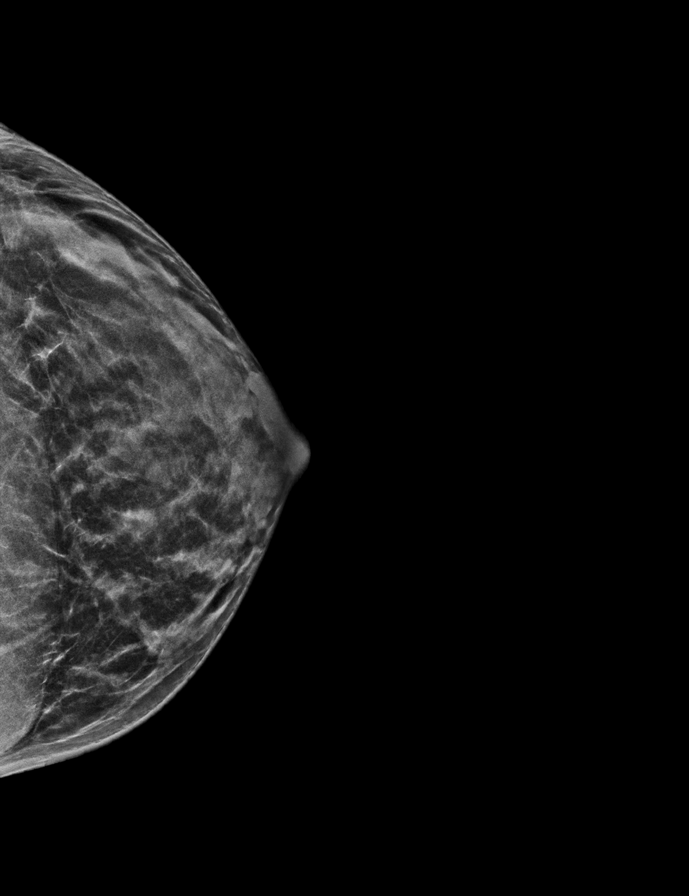

[R CC synth-2D]
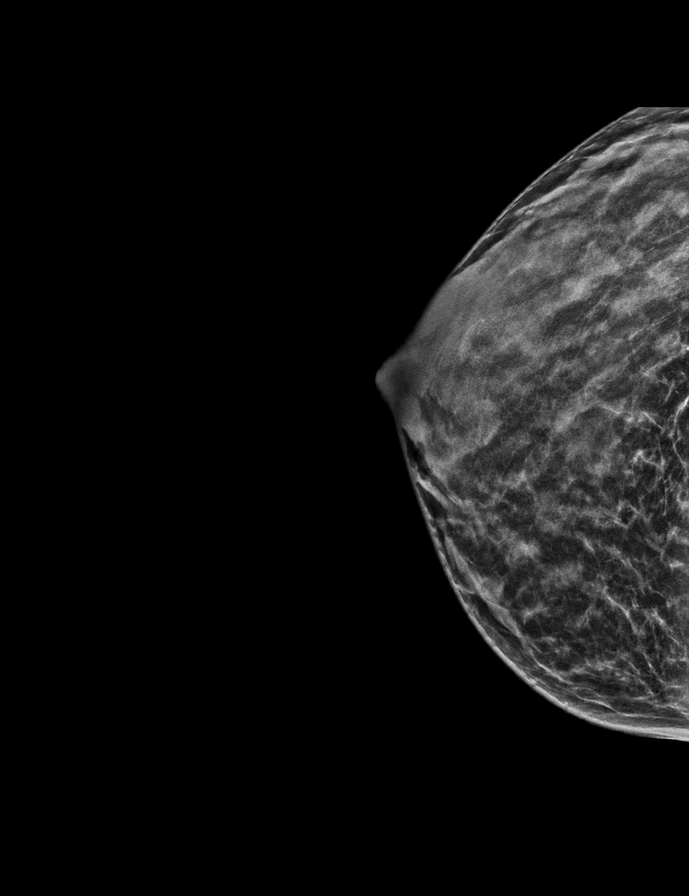

[L MLO synth-2D]
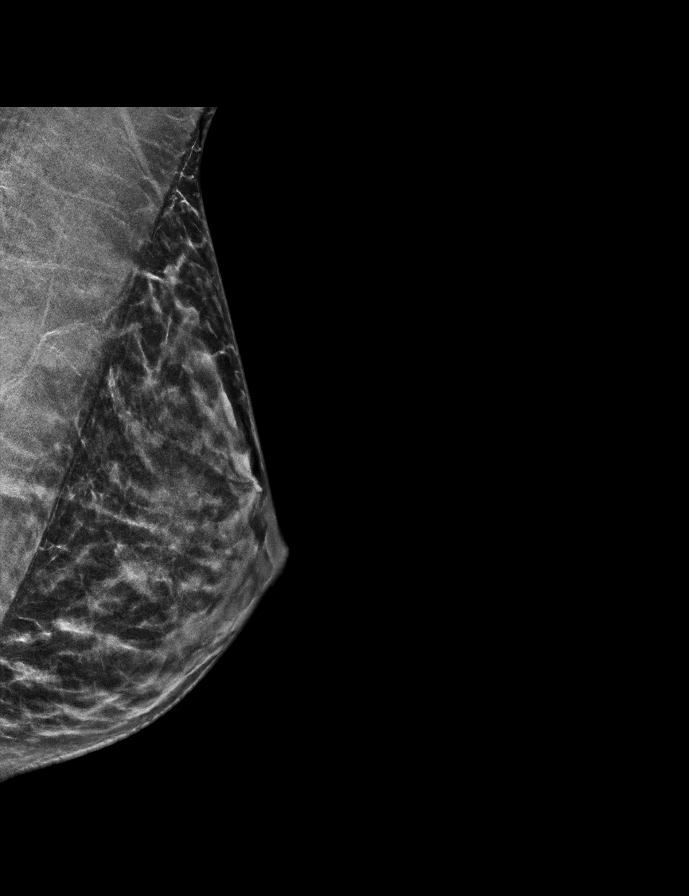

[R MLO synth-2D]
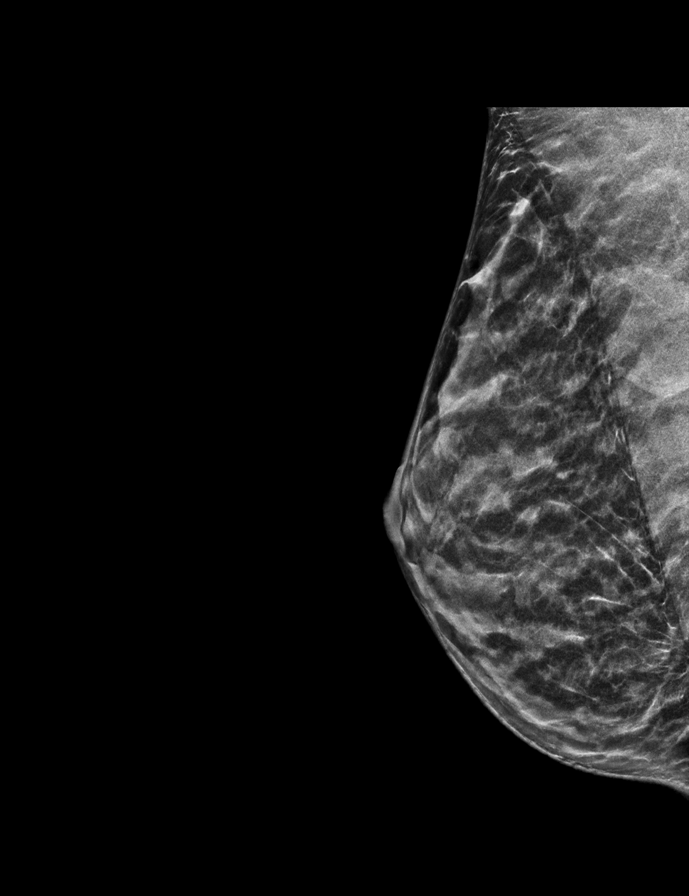

[R CC tomo · 2 of 39 frames shown]
[frame 13/39]
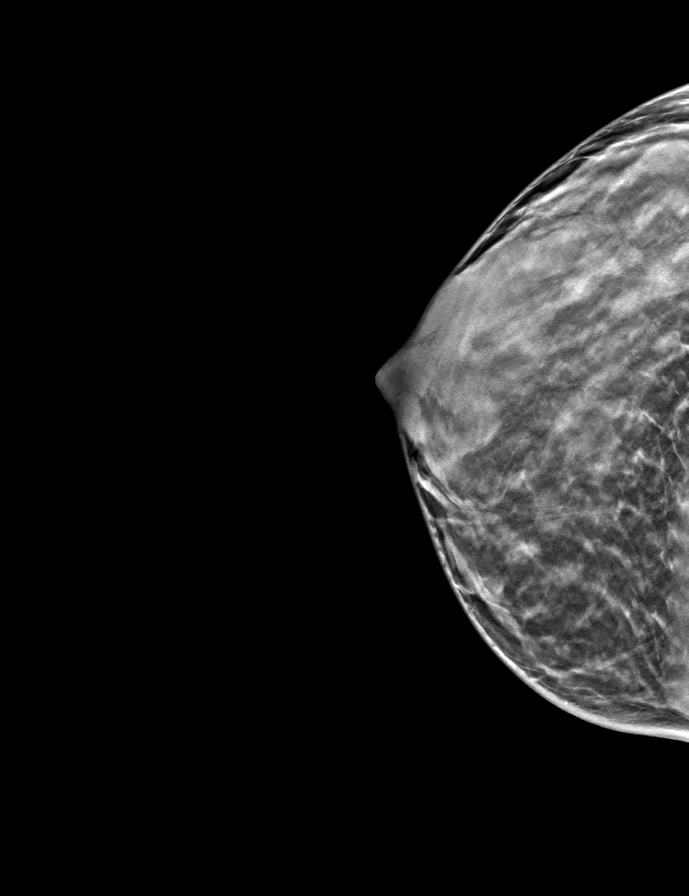
[frame 20/39]
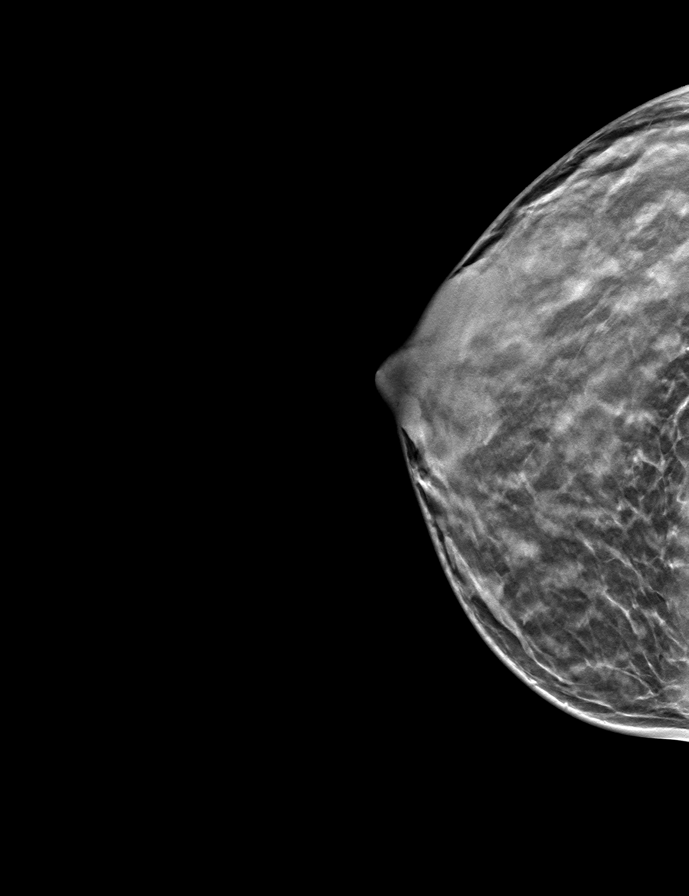

[L MLO tomo · tomo slice 23/45.0]
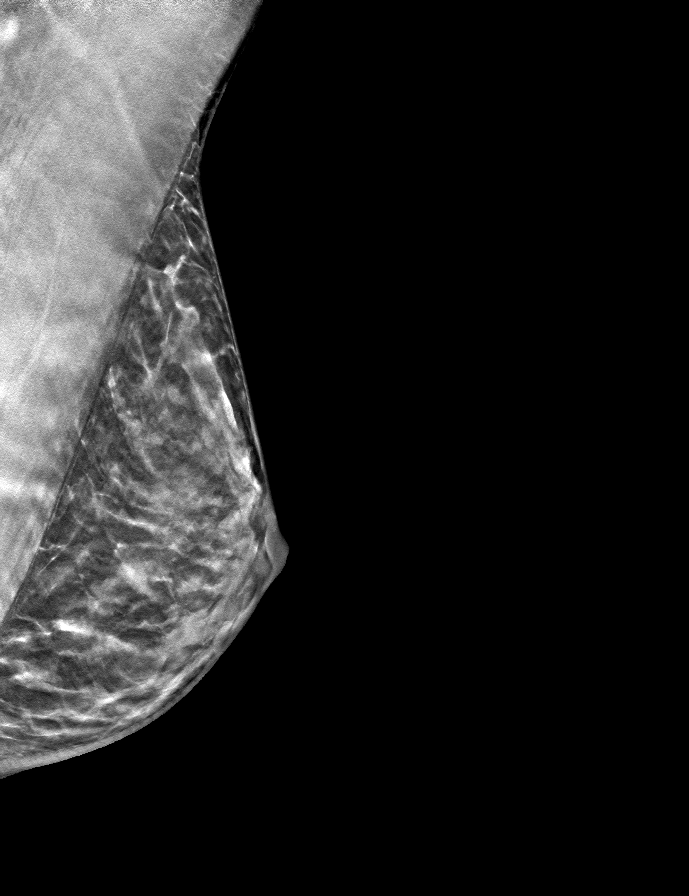

[R MLO tomo · tomo slice 23/44.0]
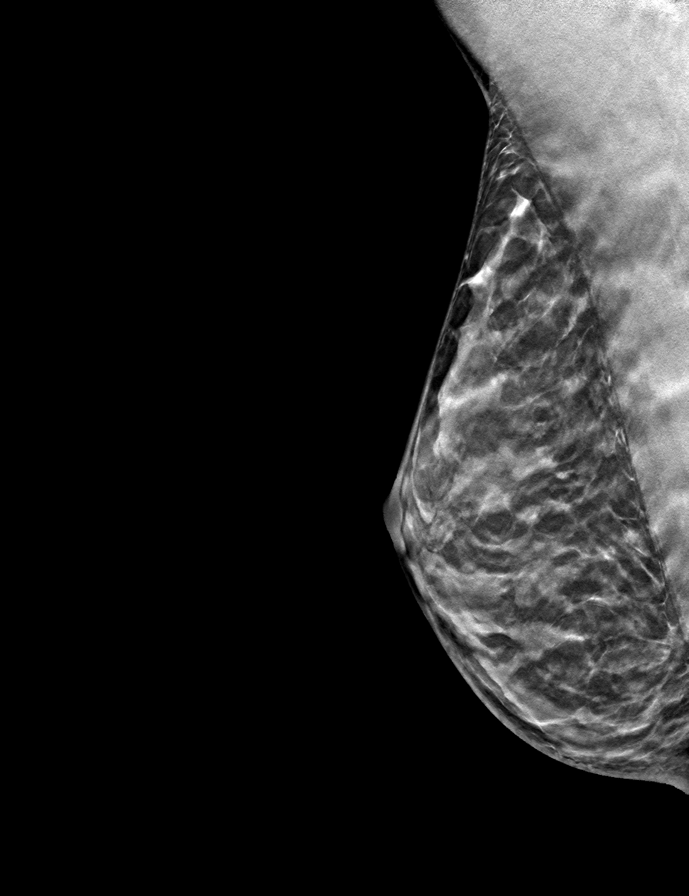

[L CC tomo · tomo slice 23/44.0]
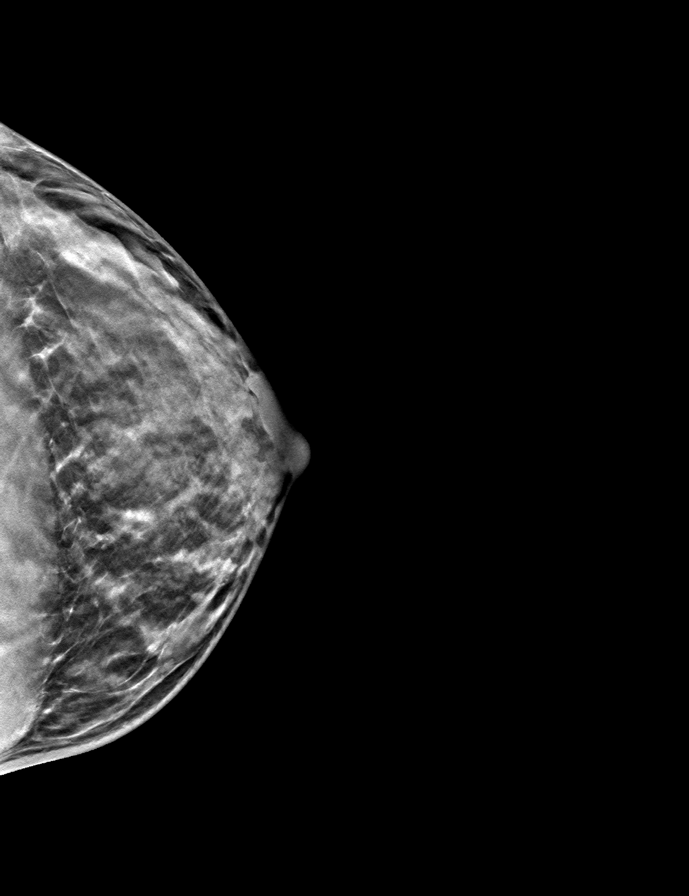

[9 of 24 positions shown; findings below may reference images not displayed]

ACR Breast Density Category d: The breast tissue is extremely dense,
which lowers the sensitivity of mammography.
FINDINGS: There are no findings suspicious for malignancy. Images were
processed with CAD.
IMPRESSION: No mammographic evidence of malignancy. A result letter of this
screening mammogram will be mailed directly to the patient.

RECOMMENDATION:
Screening mammogram in one year. (Code:F8-6-TBV)

BI-RADS CATEGORY  1: Negative.

## 2022-12-16 ENCOUNTER — Ambulatory Visit: Payer: BC Managed Care – PPO

## 2022-12-16 DIAGNOSIS — D128 Benign neoplasm of rectum: Secondary | ICD-10-CM | POA: Diagnosis not present

## 2022-12-16 DIAGNOSIS — Z1211 Encounter for screening for malignant neoplasm of colon: Secondary | ICD-10-CM | POA: Diagnosis present
